# Patient Record
Sex: Female | Born: 1990 | Race: White | Hispanic: No | Marital: Single | Smoking: Current some day smoker
Health system: Southern US, Community
[De-identification: ages and names within clinical notes are randomized; demographics above are authoritative.]

## PROBLEM LIST (undated history)

## (undated) ENCOUNTER — Inpatient Hospital Stay: Payer: Self-pay

## (undated) DIAGNOSIS — Z91410 Personal history of adult physical and sexual abuse: Secondary | ICD-10-CM

## (undated) DIAGNOSIS — F419 Anxiety disorder, unspecified: Secondary | ICD-10-CM

## (undated) DIAGNOSIS — F1910 Other psychoactive substance abuse, uncomplicated: Secondary | ICD-10-CM

## (undated) DIAGNOSIS — K2920 Alcoholic gastritis without bleeding: Secondary | ICD-10-CM

## (undated) DIAGNOSIS — F17200 Nicotine dependence, unspecified, uncomplicated: Secondary | ICD-10-CM

## (undated) DIAGNOSIS — J45909 Unspecified asthma, uncomplicated: Secondary | ICD-10-CM

## (undated) DIAGNOSIS — F32A Depression, unspecified: Secondary | ICD-10-CM

## (undated) DIAGNOSIS — F329 Major depressive disorder, single episode, unspecified: Secondary | ICD-10-CM

## (undated) DIAGNOSIS — G43909 Migraine, unspecified, not intractable, without status migrainosus: Secondary | ICD-10-CM

## (undated) HISTORY — DX: Alcoholic gastritis without bleeding: K29.20

## (undated) HISTORY — PX: NO PAST SURGERIES: SHX2092

## (undated) HISTORY — DX: Nicotine dependence, unspecified, uncomplicated: F17.200

## (undated) HISTORY — DX: Unspecified asthma, uncomplicated: J45.909

## (undated) HISTORY — DX: Migraine, unspecified, not intractable, without status migrainosus: G43.909

## (undated) HISTORY — DX: Other psychoactive substance abuse, uncomplicated: F19.10

## (undated) HISTORY — DX: Personal history of adult physical and sexual abuse: Z91.410

---
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
## 2012-04-04 ENCOUNTER — Emergency Department: Payer: Self-pay | Admitting: Emergency Medicine

## 2013-03-23 ENCOUNTER — Observation Stay: Payer: Self-pay

## 2013-05-05 ENCOUNTER — Observation Stay: Payer: Self-pay

## 2013-05-05 LAB — URINALYSIS, COMPLETE
Blood: NEGATIVE
Protein: 30
RBC,UR: 11 /HPF (ref 0–5)

## 2013-05-15 ENCOUNTER — Inpatient Hospital Stay: Payer: Self-pay

## 2013-05-15 LAB — DRUG SCREEN, URINE
Barbiturates, Ur Screen: NEGATIVE (ref ?–200)
MDMA (Ecstasy)Ur Screen: NEGATIVE (ref ?–500)
Opiate, Ur Screen: POSITIVE (ref ?–300)
Phencyclidine (PCP) Ur S: NEGATIVE (ref ?–25)
Tricyclic, Ur Screen: NEGATIVE (ref ?–1000)

## 2013-05-15 LAB — CBC WITH DIFFERENTIAL/PLATELET
Basophil #: 0 10*3/uL (ref 0.0–0.1)
Eosinophil #: 0.1 10*3/uL (ref 0.0–0.7)
HCT: 34.4 % — ABNORMAL LOW (ref 35.0–47.0)
Lymphocyte %: 8.8 %
MCHC: 34.8 g/dL (ref 32.0–36.0)
MCV: 89 fL (ref 80–100)
Monocyte #: 1 x10 3/mm — ABNORMAL HIGH (ref 0.2–0.9)
Monocyte %: 5.9 %
Neutrophil #: 14.5 10*3/uL — ABNORMAL HIGH (ref 1.4–6.5)
Platelet: 346 10*3/uL (ref 150–440)
RDW: 13.5 % (ref 11.5–14.5)
WBC: 17.1 10*3/uL — ABNORMAL HIGH (ref 3.6–11.0)

## 2013-05-15 LAB — GC/CHLAMYDIA PROBE AMP

## 2014-10-27 NOTE — H&P (Signed)
L&D Evaluation:  History:  HPI Pt is a 24 yo G2P0010 at 37.[redacted] weeks GA who presents to L&D with reports of back pain x2 months, decreased fetal movement, and leaking fluid. She states that she has been having back pain the is in the right upper back and that she is "tired of nobody doing anything about it" She reports that she did not have any tylenol so she tool ibuprofen instead and has not been able to take a bath to try to get any relief "because it isn't deep enough." She reports that she has not been feeling her baby move as much but has not been doing fetal kick counts and reports to the RN that she has keep track of her baby's  movements in "my subconscious." She reports that she "may have had a small amount of leaking today but isn't sure." She denies vb or ctx. She denies urinary urgency, frequency, or burning. Her prenatal care is significant for tobacco and marijuana use, nausea and vomiting in pregnancy.   Presents with back pain, decreased fetal movement, leaking fluid   Patient's Medical History migraine headaches   Patient's Surgical History none   Medications Pre Natal Vitamins   Allergies NKDA   Social History tobacco  drugs  MJ   Family History Non-Contributory   ROS:  ROS All systems were reviewed.  HEENT, CNS, GI, GU, Respiratory, CV, Renal and Musculoskeletal systems were found to be normal.   Exam:  Vital Signs stable   Urine Protein 30 on UA   General no apparent distress   Mental Status clear   Chest clear   Heart normal sinus rhythm   Abdomen gravid, non-tender   Back no CVAT   Edema no edema   Pelvic 3/70/-1   Mebranes Intact   FHT normal rate with no decels   FHT Description 120's +accels   Ucx regular, every 2, pt  not feeling ctx   Skin dry, no lesions, no rashes   Lymph no lymphadenopathy   Other UA- trace ketones, Specific gravity- 1.039, trace protien, negative nitrites, 1+leukocytes, 11RBC's, 47 WBC's, trace bateria, 72  epithelial cells, mucous present, calcium oxalate crystals seen   Impression:  Impression reactive NST, IUP at 37.3, back pain   Plan:  Plan discharge   Comments No cervical change since admission, UA suggests of contamination, +FM and FHT reassuring on the monitor, discussed comfort measures for back pain- handouts given, discussed use of ibuprofen in pregnancy and effects on the fetus, tylenol given in the hospital for pain, FKC discussed and importance of monitoring fetal movement. Warning s/sx discussed as well as when to call the provider and s/sx of labor.   Follow Up Appointment already scheduled   Electronic Signatures: Jannet MantisSubudhi, Geneveive Furness (CNM)  (Signed [phone]-Nov-14 05:41)  Authored: L&D Evaluation   Last Updated: 17-Nov-14 05:41 by Jannet MantisSubudhi, Cathie Bonnell (CNM)

## 2014-10-27 NOTE — H&P (Signed)
L&D Evaluation:  History Expanded:  HPI Pt is a 24 yo G2P0010 at 7931w6d gestational age who presents to L&D with regular uterine contractions since her appointment yesterday afternoon.  She states that she had her membranes stripped during that appointment.  She notes occasional vaginal spotting.  Notes positive fetal movement and no leakage of fluid.  Her prenatal care is significant for tobacco and marijuana use, nausea and vomiting in pregnancy.   Group B Strep Results Maternal (Result >5wks must be treated as unknown) negative   Maternal HIV Negative   Maternal Syphilis Ab Nonreactive   [facility]   Patient's Medical History migraine headaches   Patient's Surgical History none   Medications Pre Natal Vitamins   Allergies NKDA   Social History tobacco  drugs  MJ   Family History Non-Contributory   ROS:  ROS All systems were reviewed.  HEENT, CNS, GI, GU, Respiratory, CV, Renal and Musculoskeletal systems were found to be normal.   Exam:  Vital Signs stable  T98.5, P104, BP 127/84, RR 18   General no apparent distress   Mental Status clear   Chest clear   Heart normal sinus rhythm   Abdomen gravid, non-tender   Back no CVAT   Edema no edema   Pelvic 4cm per RN   Mebranes Ruptured   Description clear   FHT normal rate with no decels   FHT Description 120's/+accels/mod var/no decels   Ucx regular, every 2   Skin dry, no lesions, no rashes   Lymph no lymphadenopathy   Impression:  Impression reactive NST, IUP at 37.3, back pain   Plan:  Plan 1) Intrauterine pregnancy at 6731w6d gestational age, 2) labor, 3) SROM   Comments admit for labor patient missing some prenatal labs and unable to obtain.  Will have them drawn stat   Electronic Signatures: Conard NovakJackson, Zhi Geier D (MD)  (Signed [phone]-Nov-14 06:27)  Authored: L&D Evaluation   Last Updated: 27-Nov-14 06:27 by Conard NovakJackson, Brit Wernette D (MD)

## 2014-10-27 NOTE — H&P (Signed)
L&D Evaluation:  History:  HPI 24 year old G2 P0010 with EDC=05/23/2013 by a 6wk5d  ultrasound presents at 7531 2/7 weeks with c/o left calf pain x 3 days. Awoke with a leg cramp early Thursday morning and pain has never resolved. Hurts worse when she walks. Denies any injury or fall.[facility], a normal anatomy scan, and MJ and tobacco use.   Presents with left calf pain   Patient's Medical History Asthma  and migraines   Patient's Surgical History none   Medications Pre Natal Vitamins   Allergies NKDA   Social History tobacco  drugs  MJ   Family History Non-Contributory   ROS:  ROS see HPI   Exam:  Vital Signs stable  120/74   Urine Protein not completed   General no apparent distress   Mental Status clear   Abdomen gravid, non-tender   Edema no edema  measurements around both calves are 30 cm. No inflammation of LE   FHT 135 with accels to 150s to 160   Ucx absent   Skin multiple tatoos and body piercings   Impression:  Impression IUP at 31 2/7 weeks with left calf pain. R/O DVT   Plan:  Plan Doppler flow study ordered on LLE. No DVTs were seen. DC home with instructions to apply heat to calf and take Caltrate 600 Plus q hs   Electronic Signatures: Trinna BalloonGutierrez, Deion Swift L (CNM)  (Signed 06-Oct-14 02:07)  Authored: L&D Evaluation   Last Updated: 06-Oct-14 02:07 by Trinna BalloonGutierrez, Aidenjames Heckmann L (CNM)

## 2015-06-20 DIAGNOSIS — K2920 Alcoholic gastritis without bleeding: Secondary | ICD-10-CM

## 2015-06-20 HISTORY — DX: Alcoholic gastritis without bleeding: K29.20

## 2015-12-01 ENCOUNTER — Emergency Department: Payer: BLUE CROSS/BLUE SHIELD

## 2015-12-01 ENCOUNTER — Encounter: Payer: Self-pay | Admitting: Emergency Medicine

## 2015-12-01 ENCOUNTER — Emergency Department
Admission: EM | Admit: 2015-12-01 | Discharge: 2015-12-01 | Disposition: A | Discharge status: Home / Self Care | Payer: BLUE CROSS/BLUE SHIELD | Attending: Emergency Medicine | Admitting: Emergency Medicine

## 2015-12-01 DIAGNOSIS — F17210 Nicotine dependence, cigarettes, uncomplicated: Secondary | ICD-10-CM | POA: Diagnosis not present

## 2015-12-01 DIAGNOSIS — K2920 Alcoholic gastritis without bleeding: Secondary | ICD-10-CM | POA: Diagnosis not present

## 2015-12-01 DIAGNOSIS — F1290 Cannabis use, unspecified, uncomplicated: Secondary | ICD-10-CM | POA: Insufficient documentation

## 2015-12-01 DIAGNOSIS — R1011 Right upper quadrant pain: Secondary | ICD-10-CM | POA: Diagnosis present

## 2015-12-01 DIAGNOSIS — R109 Unspecified abdominal pain: Secondary | ICD-10-CM

## 2015-12-01 LAB — CBC
HEMATOCRIT: 44.7 % (ref 35.0–47.0)
HEMOGLOBIN: 15.2 g/dL (ref 12.0–16.0)
MCH: 31.6 pg (ref 26.0–34.0)
MCHC: 34 g/dL (ref 32.0–36.0)
MCV: 92.8 fL (ref 80.0–100.0)
Platelets: 364 10*3/uL (ref 150–440)
RBC: 4.81 MIL/uL (ref 3.80–5.20)
RDW: 13.3 % (ref 11.5–14.5)
WBC: 6.4 10*3/uL (ref 3.6–11.0)

## 2015-12-01 LAB — COMPREHENSIVE METABOLIC PANEL
ALBUMIN: 4.4 g/dL (ref 3.5–5.0)
ALT: 19 U/L (ref 14–54)
ANION GAP: 7 (ref 5–15)
AST: 19 U/L (ref 15–41)
Alkaline Phosphatase: 69 U/L (ref 38–126)
BILIRUBIN TOTAL: 0.3 mg/dL (ref 0.3–1.2)
BUN: 10 mg/dL (ref 6–20)
CHLORIDE: 109 mmol/L (ref 101–111)
CO2: 24 mmol/L (ref 22–32)
Calcium: 9.2 mg/dL (ref 8.9–10.3)
Creatinine, Ser: 0.68 mg/dL (ref 0.44–1.00)
GFR calc Af Amer: >60 mL/min (ref >60)
GFR calc non Af Amer: >60 mL/min (ref >60)
GLUCOSE: 64 mg/dL — AB (ref 65–99)
POTASSIUM: 4.2 mmol/L (ref 3.5–5.1)
SODIUM: 140 mmol/L (ref 135–145)
TOTAL PROTEIN: 7.3 g/dL (ref 6.5–8.1)

## 2015-12-01 LAB — URINALYSIS COMPLETE WITH MICROSCOPIC (ARMC ONLY)
Bilirubin Urine: NEGATIVE
Glucose, UA: NEGATIVE mg/dL
HGB URINE DIPSTICK: NEGATIVE
Ketones, ur: NEGATIVE mg/dL
LEUKOCYTES UA: NEGATIVE
NITRITE: POSITIVE — AB
PROTEIN: NEGATIVE mg/dL
RBC / HPF: NONE SEEN RBC/hpf (ref 0–5)
SPECIFIC GRAVITY, URINE: 1.014 (ref 1.005–1.030)
pH: 6 (ref 5.0–8.0)

## 2015-12-01 LAB — GLUCOSE, CAPILLARY: GLUCOSE-CAPILLARY: 108 mg/dL — AB (ref 65–99)

## 2015-12-01 LAB — LIPASE, BLOOD: LIPASE: 23 U/L (ref 11–51)

## 2015-12-01 MED ORDER — PANTOPRAZOLE SODIUM 40 MG PO TBEC
DELAYED_RELEASE_TABLET | ORAL | Status: AC
  Filled 2015-12-01: qty 1

## 2015-12-01 MED ORDER — ONDANSETRON 4 MG PO TBDP: 4.0000 mg | ORAL_TABLET | Freq: Four times a day (QID) | ORAL | Status: DC | PRN

## 2015-12-01 MED ORDER — GI COCKTAIL ~~LOC~~
30.0000 mL | Freq: Once | ORAL | Status: AC
  Administered 2015-12-01: 30 mL via ORAL

## 2015-12-01 MED ORDER — PANTOPRAZOLE SODIUM 40 MG PO TBEC
40.0000 mg | DELAYED_RELEASE_TABLET | Freq: Every day | ORAL | Status: DC
  Administered 2015-12-01: 40 mg via ORAL

## 2015-12-01 MED ORDER — GI COCKTAIL ~~LOC~~
ORAL | Status: AC
  Filled 2015-12-01: qty 30

## 2015-12-01 NOTE — Discharge Instructions (Signed)
You were seen in the emergency room for abdominal pain. It is important that you follow up closely with your doctor.  I suggest that you seek treatment for alcohol abuse, and alcoholism. I think that this is likely the cause of your stomach symptoms, vomiting, and he may even have an early ulcer.  Please start over-the-counter Nexium, as directed on the packaging for the next 2 weeks.  Please return to the emergency room right away if you are to develop a fever, severe nausea, your pain becomes severe or worsens, you are unable to keep food down, begin vomiting any dark or bloody fluid, you develop any dark or bloody stools, feel dehydrated, or other new concerns or symptoms arise.   Gastritis, Adult Gastritis is soreness and swelling (inflammation) of the lining of the stomach. Gastritis can develop as a sudden onset (acute) or long-term (chronic) condition. If gastritis is not treated, it can lead to stomach bleeding and ulcers. CAUSES  Gastritis occurs when the stomach lining is weak or damaged. Digestive juices from the stomach then inflame the weakened stomach lining. The stomach lining may be weak or damaged due to viral or bacterial infections. One common bacterial infection is the Helicobacter pylori infection. Gastritis can also result from excessive alcohol consumption, taking certain medicines, or having too much acid in the stomach.  SYMPTOMS  In some cases, there are no symptoms. When symptoms are present, they may include:  Pain or a burning sensation in the upper abdomen.  Nausea.  Vomiting.  An uncomfortable feeling of fullness after eating. DIAGNOSIS  Your caregiver may suspect you have gastritis based on your symptoms and a physical exam. To determine the cause of your gastritis, your caregiver may perform the following:  Blood or stool tests to check for the H pylori bacterium.  Gastroscopy. A thin, flexible tube (endoscope) is passed down the esophagus and into the  stomach. The endoscope has a light and camera on the end. Your caregiver uses the endoscope to view the inside of the stomach.  Taking a tissue sample (biopsy) from the stomach to examine under a microscope. TREATMENT  Depending on the cause of your gastritis, medicines may be prescribed. If you have a bacterial infection, such as an H pylori infection, antibiotics may be given. If your gastritis is caused by too much acid in the stomach, H2 blockers or antacids may be given. Your caregiver may recommend that you stop taking aspirin, ibuprofen, or other nonsteroidal anti-inflammatory drugs (NSAIDs). HOME CARE INSTRUCTIONS  Only take over-the-counter or prescription medicines as directed by your caregiver.  If you were given antibiotic medicines, take them as directed. Finish them even if you start to feel better.  Drink enough fluids to keep your urine clear or pale yellow.  Avoid foods and drinks that make your symptoms worse, such as:  Caffeine or alcoholic drinks.  Chocolate.  Peppermint or mint flavorings.  Garlic and onions.  Spicy foods.  Citrus fruits, such as oranges, lemons, or limes.  Tomato-based foods such as sauce, chili, salsa, and pizza.  Fried and fatty foods.  Eat small, frequent meals instead of large meals. SEEK IMMEDIATE MEDICAL CARE IF:   You have black or dark red stools.  You vomit blood or material that looks like coffee grounds.  You are unable to keep fluids down.  Your abdominal pain gets worse.  You have a fever.  You do not feel better after 1 week.  You have any other questions or concerns. MAKE  SURE YOU:  Understand these instructions.  Will watch your condition.  Will get help right away if you are not doing well or get worse.   This information is not intended to replace advice given to you by your health care provider. Make sure you discuss any questions you have with your health care provider.   Document Released: 05/30/2001  Document Revised: 12/05/2011 Document Reviewed: 07/19/2011 Elsevier Interactive Patient Education [employer]

## 2015-12-01 NOTE — ED Notes (Signed)

## 2015-12-01 NOTE — ED Provider Notes (Signed)
[facility] Emergency Department Provider Note  ____________________________________________  Time seen: Approximately 4:09 PM  I have reviewed the triage vital signs and the nursing notes.   HISTORY  Chief Complaint Emesis and Abdominal Pain    HPI Jodi Cruz is a 25 y.o. female reports no significant past medical history.  Patient reports that she woke up this morning, she felt nauseated and vomited. She reports it is yellowish, but had some slight streaks of blood in it. She had a mild discomfort in the mid to right upper abdomen since this morning. No fevers or chills. No diarrhea. No black or bloody stools. No chest pain or trouble breathing.  Denies pregnancy. No urinary symptoms or abnormal odor.   Patient does report that she has occasionally woken up in the morning with vomiting, and that she thinks it may be because she drinks whiskey almost every night. She reports symptoms have occurred before, and she has never seen any blood in her vomit.   History reviewed. No pertinent past medical history.  There are no active problems to display for this patient.   History reviewed. No pertinent past surgical history.  Current Outpatient Rx  Name  Route  Sig  Dispense  Refill  . ondansetron (ZOFRAN ODT) 4 MG disintegrating tablet   Oral   Take 1 tablet (4 mg total) by mouth every 6 (six) hours as needed for nausea or vomiting.   20 tablet   0     Allergies Review of patient's allergies indicates no known allergies.  No family history on file.  Social History Social History  Substance Use Topics  . Smoking status: Current Every Day Smoker -- 0.50 packs/day    Types: Cigarettes  . Smokeless tobacco: None  . Alcohol Use: Yes     Comment: pt states a lot, pt states has a drinking problem    Review of Systems Constitutional: No fever/chills Eyes: No visual changes. ENT: No sore throat. Cardiovascular: Denies chest  pain. Respiratory: Denies shortness of breath. Gastrointestinal: Mild sense of discomfort or uneasiness in the upper stomach. No nausea. No diarrhea.  No constipation. Genitourinary: Negative for dysuria. Musculoskeletal: Negative for back pain. Skin: Negative for rash. Neurological: Negative for headaches, focal weakness or numbness.  10-point ROS otherwise negative.  ____________________________________________   PHYSICAL EXAM:  VITAL SIGNS: ED Triage Vitals  Enc Vitals Group     BP 12/01/15 1505 141/89 mmHg     Pulse Rate 12/01/15 1505 114     Resp 12/01/15 1505 18     Temp 12/01/15 1505 97.9 F (36.6 C)     Temp Source 12/01/15 1505 Oral     SpO2 12/01/15 1505 99 %     Weight 12/01/15 1505 140 lb (63.504 kg)     Height 12/01/15 1505  (1.727 m)     Head Cir --      Peak Flow --      Pain Score 12/01/15 1506 5     Pain Loc --      Pain Edu? --      Excl. in GC? --    Constitutional: Alert and oriented. Well appearing and in no acute distress.Seated in chair, stands up and ambulates to bed easily. Pleasant. Eyes: Conjunctivae are normal. PERRL. EOMI. Head: Atraumatic. Nose: No congestion/rhinnorhea. Mouth/Throat: Mucous membranes are moist.  Oropharynx non-erythematous. Neck: No stridor.   Cardiovascular: Normal rate, regular rhythm. Grossly normal heart sounds.  Good peripheral circulation. Respiratory: Normal respiratory effort.  No retractions. Lungs CTAB. Gastrointestinal: Soft and nontender. No rebound or guarding. Negative Murphy. No distention. No abdominal bruits. No CVA tenderness. Musculoskeletal: No lower extremity tenderness nor edema.  No joint effusions. Neurologic:  Normal speech and language. No gross focal neurologic deficits are appreciated. No gait instability. Skin:  Skin is warm, dry and intact. No rash noted. Psychiatric: Mood and affect are normal. Speech and behavior are normal.  ____________________________________________   LABS (all  labs ordered are listed, but only abnormal results are displayed)  Labs Reviewed  COMPREHENSIVE METABOLIC PANEL - Abnormal; Notable for the following:    Glucose, Bld 64 (*)    All other components within normal limits  URINALYSIS COMPLETEWITH MICROSCOPIC ([facility] ONLY) - Abnormal; Notable for the following:    Color, Urine YELLOW (*)    APPearance HAZY (*)    Nitrite POSITIVE (*)    Bacteria, UA MANY (*)    Squamous Epithelial / LPF 0-5 (*)    All other components within normal limits  GLUCOSE, CAPILLARY - Abnormal; Notable for the following:    Glucose-Capillary 108 (*)    All other components within normal limits  LIPASE, BLOOD  CBC  PREGNANCY, URINE  CBG MONITORING, ED   ____________________________________________  EKG   ____________________________________________  RADIOLOGY   US Abdomen Limited RUQ (Final result) Result time: 12/01/15 16:55:14   Final result by Rad Results In Interface (12/01/15 16:55:14)   Narrative:   CLINICAL DATA: Abdominal pain since this morning, vomiting  EXAM: US ABDOMEN LIMITED - RIGHT UPPER QUADRANT  COMPARISON: None.  FINDINGS: Gallbladder:  Incompletely distended. No gallbladder wall thickening or pericholecystic fluid. No calculi identified.  Common bile duct:  Diameter: 4.6 mm. Unremarkable.  Liver:  No focal lesion identified. Within normal limits in parenchymal echogenicity.  IMPRESSION: 1. Nondistended gallbladder despite NPO status suggesting chronic cholecystitis. 2. No ultrasound evidence of acute cholecystitis or biliary obstruction.   Electronically Signed By: Corlis Leak Hassell M.D. On: 12/01/2015 16:55    ____________________________________________   PROCEDURES  Procedure(s) performed: None  Critical Care performed: No  ____________________________________________   INITIAL IMPRESSION / ASSESSMENT AND PLAN / ED COURSE  Pertinent labs & imaging results that were available during my care of  the patient were reviewed by me and considered in my medical decision making (see chart for details).  Differential diagnosis includes but is not limited to, abdominal perforation, aortic dissection, cholecystitis, appendicitis, diverticulitis, colitis, esophagitis/gastritis, kidney stone, pyelonephritis, urinary tract infection, aortic aneurysm. All are considered in decision and treatment plan. Based upon the patient's presentation and risk factors, we'll proceed with ultrasound of the right upper abdomen. Based on the history given, I do not find there is evidence of bowel obstruction, peritonitis, or acute surgical abdomen. Location very atypical anything such as appendicitis.   Hemoglobin normal. Based on the history given, symptoms to be most consistent with probable acute alcoholic gastritis versus possible peptic ulcer disease.   Patient reports feeling much improved, tolerating by mouth well. Ultrasound no evidence of acute abnormality. The patient's gallbladder is likely nondistended because she is not truly been nothing by mouth for 6 hours.  The patient reports feeling much improved, stable with normal hemoglobin. There is no evidence of peritonitis or need for CT imaging at this time. Discussed with the patient, and I suspect alcohol as much to do with her symptoms. I advised her on local and provided option for detox, also provided her with careful return precautions. She is feeling much better, and I will discharge  her to home.  Return precautions and treatment recommendations and follow-up discussed with the patient who is agreeable with the plan.   ____________________________________________   FINAL CLINICAL IMPRESSION(S) / ED DIAGNOSES  Final diagnoses:  Abdominal pain  Alcoholic gastritis      Sharyn Creamer, MD 12/01/15 2104

## 2015-12-01 NOTE — ED Notes (Signed)
Pt presents to ED with reports of vomiting blood and abdominal pain that began this morning. Pt states vomited bile mixed with blood x 3 today. Pt reports RUQ abdominal pain. Pt denies diarrhea. Pt states was drinking whiskey last night but not as much as she normally does.

## 2016-10-27 ENCOUNTER — Encounter: Payer: Self-pay | Admitting: *Deleted

## 2016-10-27 ENCOUNTER — Emergency Department: Payer: BLUE CROSS/BLUE SHIELD

## 2016-10-27 ENCOUNTER — Emergency Department
Admission: EM | Admit: 2016-10-27 | Discharge: 2016-10-27 | Disposition: A | Discharge status: Home / Self Care | Payer: BLUE CROSS/BLUE SHIELD | Attending: Emergency Medicine | Admitting: Emergency Medicine

## 2016-10-27 DIAGNOSIS — F17210 Nicotine dependence, cigarettes, uncomplicated: Secondary | ICD-10-CM | POA: Insufficient documentation

## 2016-10-27 DIAGNOSIS — J302 Other seasonal allergic rhinitis: Secondary | ICD-10-CM | POA: Diagnosis not present

## 2016-10-27 DIAGNOSIS — R0789 Other chest pain: Secondary | ICD-10-CM | POA: Diagnosis not present

## 2016-10-27 DIAGNOSIS — R079 Chest pain, unspecified: Secondary | ICD-10-CM | POA: Diagnosis present

## 2016-10-27 LAB — BASIC METABOLIC PANEL
Anion gap: 8 (ref 5–15)
BUN: 13 mg/dL (ref 6–20)
CO2: 22 mmol/L (ref 22–32)
Calcium: 9 mg/dL (ref 8.9–10.3)
Chloride: 108 mmol/L (ref 101–111)
Creatinine, Ser: 0.79 mg/dL (ref 0.44–1.00)
GFR calc Af Amer: >60 mL/min (ref >60)
GLUCOSE: 89 mg/dL (ref 65–99)
Potassium: 3.9 mmol/L (ref 3.5–5.1)
Sodium: 138 mmol/L (ref 135–145)

## 2016-10-27 LAB — TROPONIN I: Troponin I: <0.03 ng/mL (ref <0.03)

## 2016-10-27 LAB — CBC
HEMATOCRIT: 41.4 % (ref 35.0–47.0)
HEMOGLOBIN: 14.3 g/dL (ref 12.0–16.0)
MCH: 32.6 pg (ref 26.0–34.0)
MCHC: 34.5 g/dL (ref 32.0–36.0)
MCV: 94.4 fL (ref 80.0–100.0)
Platelets: 424 10*3/uL (ref 150–440)
RBC: 4.39 MIL/uL (ref 3.80–5.20)
RDW: 13.3 % (ref 11.5–14.5)
WBC: 7.2 10*3/uL (ref 3.6–11.0)

## 2016-10-27 MED ORDER — ALBUTEROL SULFATE HFA 108 (90 BASE) MCG/ACT IN AERS
2.0000 | INHALATION_SPRAY | RESPIRATORY_TRACT | 0 refills | Status: DC | PRN
Start: 2016-10-27 — End: 2017-03-09

## 2016-10-27 MED ORDER — DIPHENHYDRAMINE HCL 25 MG PO CAPS: 25.0000 mg | ORAL_CAPSULE | Freq: Four times a day (QID) | ORAL | 0 refills | Status: DC | PRN

## 2016-10-27 MED ORDER — ASPIRIN 81 MG PO CHEW
324.0000 mg | CHEWABLE_TABLET | Freq: Once | ORAL | Status: AC
Start: 2016-10-27 — End: 2016-10-27
  Administered 2016-10-27: 324 mg via ORAL

## 2016-10-27 MED ORDER — ASPIRIN 81 MG PO CHEW
CHEWABLE_TABLET | ORAL | Status: AC
  Filled 2016-10-27: qty 4

## 2016-10-27 NOTE — ED Triage Notes (Addendum)
Pt states chest pressure that began last night, states cocaine use 2 days ago, denies any nausea or SOB, awake and alert in no acute distress

## 2016-10-27 NOTE — ED Provider Notes (Signed)
[facility] Emergency Department Provider Note  ____________________________________________  Time seen: Approximately 7:42 PM  I have reviewed the triage vital signs and the nursing notes.   HISTORY  Chief Complaint Chest Pain    HPI Jodi HarnessHaley M Donnelly is a 26 y.o. female who complains  chest tightness that started last night, is been persistent over the last 12-18 hours. [place] to [place]. She has been having seasonal allergies and rhinorrhea over the last few days, and also has a nonproductive cough. She also used cocaine 2 days ago which she reports was a small amount compared to what she typically does. She uses intranasally and has never injected. Denies fever chills. No vomiting or diaphoresis or shortness of breath. No exertional symptoms. No pleuritic symptoms.  Chest tightness is mild, constant, nonradiating without aggravating or alleviating factors. She feels it in the central part of the chest   History reviewed. No pertinent past medical history.   There are no active problems to display for this patient.    History reviewed. No pertinent surgical history.   Prior to Admission medications   Medication Sig Start Date End Date Taking? Authorizing Provider  albuterol (PROVENTIL HFA) 108 (90 Base) MCG/ACT inhaler Inhale 2 puffs into the lungs every 4 (four) hours as needed for wheezing or shortness of breath. 10/27/16   Sharman CheekStafford, Laniya Friedl, MD  diphenhydrAMINE (BENADRYL) 25 mg capsule Take 1 capsule (25 mg total) by mouth every 6 (six) hours as needed. 10/27/16   Sharman CheekStafford, Kendarious Gudino, MD  ondansetron (ZOFRAN ODT) 4 MG disintegrating tablet Take 1 tablet (4 mg total) by mouth every 6 (six) hours as needed for nausea or vomiting. 12/01/15   Sharyn CreamerQuale, Mark, MD     Allergies Patient has no known allergies.   History reviewed. No pertinent family history.  Social History Social History  Substance Use Topics  . Smoking  status: Current Every Day Smoker    Packs/day: 0.50    Types: Cigarettes  . Smokeless tobacco: Not on file  . Alcohol use Yes     Comment: pt states a lot, pt states has a drinking problem  Occasional cocaine use  Review of Systems  Constitutional:   No fever or chills.  ENT:   No sore throat. No rhinorrhea. Lymphatic: No swollen glands, No extremity swelling Endocrine: No hot/cold flashes. No significant weight change. No neck swelling. Cardiovascular:   No chest pain or syncope. Respiratory:   No dyspnea or cough. Gastrointestinal:   Negative for abdominal pain, vomiting and diarrhea.  Genitourinary:   Negative for dysuria or difficulty urinating. Musculoskeletal:   Negative for focal pain or swelling Neurological:   Negative for headaches or weakness. All other systems reviewed and are negative except as documented above in ROS and HPI.  ____________________________________________   PHYSICAL EXAM:  VITAL SIGNS: ED Triage Vitals  Enc Vitals Group     BP 10/27/16 1709 (!) 147/105     Pulse Rate 10/27/16 1709 94     Resp 10/27/16 1709 16     Temp 10/27/16 1709 98.3 F (36.8 C)     Temp Source 10/27/16 1709 Oral     SpO2 10/27/16 1709 98 %     Weight 10/27/16 1709 155 lb (70.3 kg)     Height 10/27/16 1709 5\' 8"  (1.727 m)     Head Circumference --      Peak Flow --      Pain Score 10/27/16 1708 5  Pain Loc --      Pain Edu? --      Excl. in GC? --     Vital signs reviewed, nursing assessments reviewed.   Constitutional:   Alert and oriented. Well appearing and in no distress. Eyes:   No scleral icterus. No conjunctival pallor. PERRL. EOMI.  No nystagmus. ENT   Head:   Normocephalic and atraumatic.   Nose:   Positive rhinorrhea. No septal hematoma or epistaxis   Mouth/Throat:   MMM, no pharyngeal erythema. No peritonsillar mass.    Neck:   No stridor. No SubQ emphysema. No meningismus. Hematological/Lymphatic/Immunilogical:   No cervical  lymphadenopathy. Cardiovascular:   RRR. Symmetric bilateral radial and DP pulses.  No murmurs.  Respiratory:   Normal respiratory effort without tachypnea nor retractions. Coarse breath sounds diffusely with slight expiratory wheezing  Gastrointestinal:   Soft and nontender. Non distended. There is no CVA tenderness.  No rebound, rigidity, or guarding. Genitourinary:   deferred Musculoskeletal:   Normal range of motion in all extremities. No joint effusions.  No lower extremity tenderness.  No edema. Neurologic:   Normal speech and language.  CN 2-10 normal. Motor grossly intact. No gross focal neurologic deficits are appreciated.  Skin:    Skin is warm, dry and intact. No rash noted.  No petechiae, purpura, or bullae.  ____________________________________________    LABS (pertinent positives/negatives) (all labs ordered are listed, but only abnormal results are displayed) Labs Reviewed  BASIC METABOLIC PANEL  CBC  TROPONIN I   ____________________________________________   EKG  Interpreted by me Sinus rhythm rate of 80, normal axis and intervals. Poor R-wave progression in anterior precordial leads. Normal ST segments and T waves.  ____________________________________________    RADIOLOGY  Dg Chest 2 View  Result Date: 10/27/2016 CLINICAL DATA:  Chest pain and shortness of breath EXAM: CHEST  2 VIEW COMPARISON:  None. FINDINGS: The heart size and mediastinal contours are within normal limits. Both lungs are clear. The visualized skeletal structures are unremarkable. IMPRESSION: No active cardiopulmonary disease. Electronically Signed   By: Deatra Robinson M.D.   On: 10/27/2016 17:40    ____________________________________________   PROCEDURES Procedures  ____________________________________________   INITIAL IMPRESSION / ASSESSMENT AND PLAN / ED COURSE  Pertinent labs & imaging results that were available during my care of the patient were reviewed by me and  considered in my medical decision making (see chart for details).  Patient well appearing no acute distress. Presents with symptoms consistent with seasonal allergies. Chest tightness like related to some mild bronchospasm as well given the auscultation findings. Symptoms do not appear to be related to recent cocaine use, not consistent with ACS PE dissection. We'll give aspirin for cardioprotection given her recent cocaine use, Benadryl and albuterol for allergic symptoms. Follow up her primary care. Counseled on cocaine avoidance.         ____________________________________________   FINAL CLINICAL IMPRESSION(S) / ED DIAGNOSES  Final diagnoses:  Atypical chest pain  Seasonal allergic rhinitis, unspecified trigger      New Prescriptions   ALBUTEROL (PROVENTIL HFA) 108 (90 BASE) MCG/ACT INHALER    Inhale 2 puffs into the lungs every 4 (four) hours as needed for wheezing or shortness of breath.   DIPHENHYDRAMINE (BENADRYL) 25 MG CAPSULE    Take 1 capsule (25 mg total) by mouth every 6 (six) hours as needed.     Portions of this note were generated with dragon dictation software. Dictation errors may occur despite best attempts at proofreading.  Sharman Cheek, MD 10/27/16 1946

## 2017-03-09 ENCOUNTER — Encounter: Payer: Self-pay | Admitting: Certified Nurse Midwife

## 2017-03-09 ENCOUNTER — Ambulatory Visit (INDEPENDENT_AMBULATORY_CARE_PROVIDER_SITE_OTHER): Payer: BLUE CROSS/BLUE SHIELD | Admitting: Certified Nurse Midwife

## 2017-03-09 VITALS — BP 118/68 | Wt 177.0 lb

## 2017-03-09 DIAGNOSIS — F1910 Other psychoactive substance abuse, uncomplicated: Secondary | ICD-10-CM

## 2017-03-09 DIAGNOSIS — Z113 Encounter for screening for infections with a predominantly sexual mode of transmission: Secondary | ICD-10-CM

## 2017-03-09 DIAGNOSIS — O0990 Supervision of high risk pregnancy, unspecified, unspecified trimester: Secondary | ICD-10-CM

## 2017-03-09 DIAGNOSIS — Z87898 Personal history of other specified conditions: Secondary | ICD-10-CM

## 2017-03-09 DIAGNOSIS — F17200 Nicotine dependence, unspecified, uncomplicated: Secondary | ICD-10-CM | POA: Insufficient documentation

## 2017-03-09 DIAGNOSIS — Z124 Encounter for screening for malignant neoplasm of cervix: Secondary | ICD-10-CM

## 2017-03-09 DIAGNOSIS — J45909 Unspecified asthma, uncomplicated: Secondary | ICD-10-CM | POA: Insufficient documentation

## 2017-03-09 DIAGNOSIS — G43909 Migraine, unspecified, not intractable, without status migrainosus: Secondary | ICD-10-CM | POA: Insufficient documentation

## 2017-03-09 DIAGNOSIS — F1911 Other psychoactive substance abuse, in remission: Secondary | ICD-10-CM

## 2017-03-09 NOTE — Progress Notes (Signed)
Pt c/o mild nausea/vomiting. Has not started PNV.
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
## 2017-03-10 LAB — RPR+RH+ABO+RUB AB+AB SCR+CB...
Antibody Screen: NEGATIVE
HIV Screen 4th Generation wRfx: NONREACTIVE
Hematocrit: 42.5 % (ref 34.0–46.6)
Hemoglobin: 13.8 g/dL (ref 11.1–15.9)
Hepatitis B Surface Ag: NEGATIVE
MCH: 31.4 pg (ref 26.6–33.0)
MCHC: 32.5 g/dL (ref 31.5–35.7)
MCV: 97 fL (ref 79–97)
PLATELETS: 411 10*3/uL — AB (ref 150–379)
RBC: 4.4 x10E6/uL (ref 3.77–5.28)
RDW: 14 % (ref 12.3–15.4)
RPR Ser Ql: NONREACTIVE
RUBELLA: 1 {index} (ref >0.99)
Rh Factor: POSITIVE
VARICELLA: 764 {index} (ref >165)
WBC: 8.9 10*3/uL (ref 3.4–10.8)

## 2017-03-14 LAB — PAP IG, CT-NG, RFX HPV ALL
CHLAMYDIA, NUC. ACID AMP: NEGATIVE
GONOCOCCUS BY NUCLEIC ACID AMP: NEGATIVE
PAP Smear Comment: 0

## 2017-03-16 ENCOUNTER — Other Ambulatory Visit: Payer: Self-pay | Admitting: Obstetrics and Gynecology

## 2017-03-16 DIAGNOSIS — O3680X0 Pregnancy with inconclusive fetal viability, not applicable or unspecified: Secondary | ICD-10-CM

## 2017-03-16 LAB — URINE DRUG PANEL 7
AMPHETAMINES, URINE: NEGATIVE ng/mL
BARBITURATE QUANT UR: NEGATIVE ng/mL
Benzodiazepine Quant, Ur: NEGATIVE ng/mL
CANNABINOID QUANT UR: POSITIVE — AB
COCAINE (METAB.): NEGATIVE ng/mL
OPIATE QUANT UR: NEGATIVE ng/mL
PCP QUANT UR: NEGATIVE ng/mL

## 2017-03-16 LAB — URINE CULTURE

## 2017-03-17 ENCOUNTER — Telehealth: Payer: Self-pay | Admitting: Certified Nurse Midwife

## 2017-03-17 DIAGNOSIS — O2341 Unspecified infection of urinary tract in pregnancy, first trimester: Secondary | ICD-10-CM | POA: Insufficient documentation

## 2017-03-17 MED ORDER — CEPHALEXIN 500 MG PO CAPS
500.0000 mg | ORAL_CAPSULE | Freq: Three times a day (TID) | ORAL | 0 refills | Status: DC
Start: 2017-03-17 — End: 2017-05-08

## 2017-03-17 NOTE — Telephone Encounter (Signed)
Urine culture returned >100K E. Coli. RX for Keflex 500 mgm TID x 7days. Increase water intake. No dysuria. Urinary frequency only. Wipe front to back. Urinate after IC.

## 2017-03-18 NOTE — Telephone Encounter (Signed)
This is a duplicate.

## 2017-03-23 ENCOUNTER — Ambulatory Visit (INDEPENDENT_AMBULATORY_CARE_PROVIDER_SITE_OTHER): Payer: BLUE CROSS/BLUE SHIELD | Admitting: Obstetrics and Gynecology

## 2017-03-23 ENCOUNTER — Ambulatory Visit (INDEPENDENT_AMBULATORY_CARE_PROVIDER_SITE_OTHER): Payer: BLUE CROSS/BLUE SHIELD

## 2017-03-23 VITALS — BP 120/82 | Wt 176.0 lb

## 2017-03-23 DIAGNOSIS — O3680X0 Pregnancy with inconclusive fetal viability, not applicable or unspecified: Secondary | ICD-10-CM

## 2017-03-23 DIAGNOSIS — O0990 Supervision of high risk pregnancy, unspecified, unspecified trimester: Secondary | ICD-10-CM

## 2017-03-23 DIAGNOSIS — Z3A01 Less than 8 weeks gestation of pregnancy: Secondary | ICD-10-CM

## 2017-03-23 DIAGNOSIS — Z1379 Encounter for other screening for genetic and chromosomal anomalies: Secondary | ICD-10-CM

## 2017-03-23 NOTE — Progress Notes (Signed)
Dating U/S Breast tenderness

## 2017-03-23 NOTE — Progress Notes (Signed)
Routine Prenatal Care Visit  Subjective  Jodi Cruz is a 26 y.o. G2P1001 at [redacted]w[redacted]d being seen today for ongoing prenatal care.  She is currently monitored for the following issues for this high-risk pregnancy and has History of substance abuse; Supervision of high risk pregnancy, antepartum; Tobacco dependence; Substance abuse (HCC); Migraine headache; and Childhood asthma on her problem list.  ----------------------------------------------------------------------------------- Patient reports no complaints.    . Vag. Bleeding: None.  Movement: Absent. Denies leaking of fluid.  ----------------------------------------------------------------------------------- The following portions of the patient's history were reviewed and updated as appropriate: allergies, current medications, past family history, past medical history, past social history, past surgical history and problem list. Problem list updated.   Objective  Weight 176 lb (79.8 kg), last menstrual period 01/29/2017. Pregravid weight 177 lb (80.3 kg) Total Weight Gain -1 lb (-0.454 kg) Urinalysis: Urine Protein: Negative Urine Glucose: Negative  Fetal Status: Fetal Heart Rate (bpm): 130   Movement: Absent     General:  Alert, oriented and cooperative. Patient is in no acute distress.  Skin: Skin is warm and dry. No rash noted.   Cardiovascular: Normal heart rate noted  Respiratory: Normal respiratory effort, no problems with respiration noted  Abdomen: Soft, gravid, appropriate for gestational age.       Pelvic:  Cervical exam deferred        Extremities: Normal range of motion.     ental Status: Normal mood and affect. Normal behavior. Normal judgment and thought content.   Based on ultrasound examination today, EDC changed to 11/11/2016 [place] Criteria for Re-dating Pregnancy  Dating Dating Discrepancy  [redacted]w[redacted]d >5 days  [redacted]w[redacted]d - [redacted]w[redacted]d >7 days  [redacted]w[redacted]d - [redacted]w[redacted]d >7 days  [redacted]w[redacted]d - [redacted]w[redacted]d >10 days  [redacted]w[redacted]d - [redacted]w[redacted]d >14 days  [redacted]w[redacted]d  and above >21 days   ACOG Committee Opinion 700 May 2017 "Methods for Estimating the Due Date"   Assessment   26 y.o. G2P1001 at [redacted]w[redacted]d by  11/05/2017, by Last Menstrual Period presenting for routine prenatal visit  Plan   pregnancy #2 Problems (from 03/09/17 to present)    Problem Noted Resolved   Supervision of high risk pregnancy, antepartum 03/09/2017 by Farrel Conners, CNM No   Overview Addendum 03/17/2017 12:49 PM by Farrel Conners, CNM     Clinic [facility] Prenatal Labs  Dating  Blood type:   A POS  Genetic Screen 1 Screen:    AFP:     Quad:     NIPS: Antibody: negative  Anatomic [place]  Rubella:  Immune; Varicella: Immune  GTT Early:               Third trimester:  RPR:   negative  Flu vaccine  HBsAg:   negative  TDaP vaccine                                               Rhogam: HIV:   negative  Baby Food                                               GBS: (For PCN allergy, check sensitivities)  Contraception  Pap:NIL  Circumcision  GC/Chlamydia: neg/neg UDS: positive MJ Urine C&S: 100K e.coli  Pediatrician    Support  Person                 Preterm labor symptoms and general obstetric precautions including but not limited to vaginal bleeding, contractions, leaking of fluid and fetal movement were reviewed in detail with the patient. Please refer to After Visit Summary for other counseling recommendations.   Return in about 4 weeks (around 04/20/2017) for ROB in 4 weeks, ROB and 1st trimester [place] in 6 weeks.

## 2017-03-23 NOTE — Patient Instructions (Signed)
First Trimester of Pregnancy The first trimester of pregnancy is from week 1 until the end of week 13 (months 1 through 3). A week after a sperm fertilizes an egg, the egg will implant on the wall of the uterus. This embryo will begin to develop into a baby. Genes from you and your partner will form the baby. The female genes will determine whether the baby will be a boy or a girl. At 6-8 weeks, the eyes and face will be formed, and the heartbeat can be seen on ultrasound. At the end of 12 weeks, all the baby's organs will be formed. Now that you are pregnant, you will want to do everything you can to have a healthy baby. Two of the most important things are to get good prenatal care and to follow your health care provider's instructions. Prenatal care is all the medical care you receive before the baby's birth. This care will help prevent, find, and treat any problems during the pregnancy and childbirth. Body changes during your first trimester Your body goes through many changes during pregnancy. The changes vary from woman to woman.  You may gain or lose a couple of pounds at first.  You may feel sick to your stomach (nauseous) and you may throw up (vomit). If the vomiting is uncontrollable, call your health care provider.  You may tire easily.  You may develop headaches that can be relieved by medicines. All medicines should be approved by your health care provider.  You may urinate more often. Painful urination may mean you have a bladder infection.  You may develop heartburn as a result of your pregnancy.  You may develop constipation because certain hormones are causing the muscles that push stool through your intestines to slow down.  You may develop hemorrhoids or swollen veins (varicose veins).  Your breasts may begin to grow larger and become tender. Your nipples may stick out more, and the tissue that surrounds them (areola) may become darker.  Your gums may bleed and may be  sensitive to brushing and flossing.  Dark spots or blotches (chloasma, mask of pregnancy) may develop on your face. This will likely fade after the baby is born.  Your menstrual periods will stop.  You may have a loss of appetite.  You may develop cravings for certain kinds of food.  You may have changes in your emotions from day to day, such as being excited to be pregnant or being concerned that something may go wrong with the pregnancy and baby.  You may have more vivid and strange dreams.  You may have changes in your hair. These can include thickening of your hair, rapid growth, and changes in texture. Some women also have hair loss during or after pregnancy, or hair that feels dry or thin. Your hair will most likely return to normal after your baby is born.  What to expect at prenatal visits During a routine prenatal visit:  You will be weighed to make sure you and the baby are growing normally.  Your blood pressure will be taken.  Your abdomen will be measured to track your baby's growth.  The fetal heartbeat will be listened to between weeks 10 and 14 of your pregnancy.  Test results from any previous visits will be discussed.  Your health care provider may ask you:  How you are feeling.  If you are feeling the baby move.  If you have had any abnormal symptoms, such as leaking fluid, bleeding, severe headaches,   or abdominal cramping.  If you are using any tobacco products, including cigarettes, chewing tobacco, and electronic cigarettes.  If you have any questions.  Other tests that may be performed during your first trimester include:  Blood tests to find your blood type and to check for the presence of any previous infections. The tests will also be used to check for low iron levels (anemia) and protein on red blood cells (Rh antibodies). Depending on your risk factors, or if you previously had diabetes during pregnancy, you may have tests to check for high blood  sugar that affects pregnant women (gestational diabetes).  Urine tests to check for infections, diabetes, or protein in the urine.  An ultrasound to confirm the proper growth and development of the baby.  Fetal screens for spinal cord problems (spina bifida) and Down syndrome.  HIV (human immunodeficiency virus) testing. Routine prenatal testing includes screening for HIV, unless you choose not to have this test.  You may need other tests to make sure you and the baby are doing well.  Follow these instructions at home: Medicines  Follow your health care provider's instructions regarding medicine use. Specific medicines may be either safe or unsafe to take during pregnancy.  Take a prenatal vitamin that contains at least 600 micrograms (mcg) of folic acid.  If you develop constipation, try taking a stool softener if your health care provider approves. Eating and drinking  Eat a balanced diet that includes fresh fruits and vegetables, whole grains, good sources of protein such as meat, eggs, or tofu, and low-fat dairy. Your health care provider will help you determine the amount of weight gain that is right for you.  Avoid raw meat and uncooked cheese. These carry germs that can cause birth defects in the baby.  Eating four or five small meals rather than three large meals a day may help relieve nausea and vomiting. If you start to feel nauseous, eating a few soda crackers can be helpful. Drinking liquids between meals, instead of during meals, also seems to help ease nausea and vomiting.  Limit foods that are high in fat and processed sugars, such as fried and sweet foods.  To prevent constipation: ? Eat foods that are high in fiber, such as fresh fruits and vegetables, whole grains, and beans. ? Drink enough fluid to keep your urine clear or pale yellow. Activity  Exercise only as directed by your health care provider. Most women can continue their usual exercise routine during  pregnancy. Try to exercise for 30 minutes at least 5 days a week. Exercising will help you: ? Control your weight. ? Stay in shape. ? Be prepared for labor and delivery.  Experiencing pain or cramping in the lower abdomen or lower back is a good sign that you should stop exercising. Check with your health care provider before continuing with normal exercises.  Try to avoid standing for long periods of time. Move your legs often if you must stand in one place for a long time.  Avoid heavy lifting.  Wear low-heeled shoes and practice good posture.  You may continue to have sex unless your health care provider tells you not to. Relieving pain and discomfort  Wear a good support bra to relieve breast tenderness.  Take warm sitz baths to soothe any pain or discomfort caused by hemorrhoids. Use hemorrhoid cream if your health care provider approves.  Rest with your legs elevated if you have leg cramps or low back pain.  If you develop   varicose veins in your legs, wear support hose. Elevate your feet for 15 minutes, 3-4 times a day. Limit salt in your diet. Prenatal care  Schedule your prenatal visits by the twelfth week of pregnancy. They are usually scheduled monthly at first, then more often in the last 2 months before delivery.  Write down your questions. Take them to your prenatal visits.  Keep all your prenatal visits as told by your health care provider. This is important. Safety  Wear your seat belt at all times when driving.  Make a list of emergency phone numbers, including numbers for family, friends, the hospital, and police and fire departments. General instructions  Ask your health care provider for a referral to a local prenatal education class. Begin classes no later than the beginning of month 6 of your pregnancy.  Ask for help if you have counseling or nutritional needs during pregnancy. Your health care provider can offer advice or refer you to specialists for help  with various needs.  Do not use hot tubs, steam rooms, or saunas.  Do not douche or use tampons or scented sanitary pads.  Do not cross your legs for long periods of time.  Avoid cat litter boxes and soil used by cats. These carry germs that can cause birth defects in the baby and possibly loss of the fetus by miscarriage or stillbirth.  Avoid all smoking, herbs, alcohol, and medicines not prescribed by your health care provider. Chemicals in these products affect the formation and growth of the baby.  Do not use any products that contain nicotine or tobacco, such as cigarettes and e-cigarettes. If you need help quitting, ask your health care provider. You may receive counseling support and other resources to help you quit.  Schedule a dentist appointment. At home, brush your teeth with a soft toothbrush and be gentle when you floss. Contact a health care provider if:  You have dizziness.  You have mild pelvic cramps, pelvic pressure, or nagging pain in the abdominal area.  You have persistent nausea, vomiting, or diarrhea.  You have a bad smelling vaginal discharge.  You have pain when you urinate.  You notice increased swelling in your face, hands, legs, or ankles.  You are exposed to fifth disease or chickenpox.  You are exposed to German measles (rubella) and have never had it. Get help right away if:  You have a fever.  You are leaking fluid from your vagina.  You have spotting or bleeding from your vagina.  You have severe abdominal cramping or pain.  You have rapid weight gain or loss.  You vomit blood or material that looks like coffee grounds.  You develop a severe headache.  You have shortness of breath.  You have any kind of trauma, such as from a fall or a car accident. Summary  The first trimester of pregnancy is from week 1 until the end of week 13 (months 1 through 3).  Your body goes through many changes during pregnancy. The changes vary from  woman to woman.  You will have routine prenatal visits. During those visits, your health care provider will examine you, discuss any test results you may have, and talk with you about how you are feeling. This information is not intended to replace advice given to you by your health care provider. Make sure you discuss any questions you have with your health care provider. Document Released: 05/30/2001 Document Revised: 05/17/2016 Document Reviewed: 05/17/2016 Elsevier Interactive Patient Education  2017 [employer]   [employer]  

## 2017-03-30 ENCOUNTER — Encounter: Payer: Self-pay | Admitting: Certified Nurse Midwife

## 2017-04-02 ENCOUNTER — Encounter: Payer: Self-pay | Admitting: Certified Nurse Midwife

## 2017-04-14 ENCOUNTER — Emergency Department: Payer: BLUE CROSS/BLUE SHIELD

## 2017-04-14 ENCOUNTER — Encounter: Payer: Self-pay | Admitting: Obstetrics and Gynecology

## 2017-04-14 ENCOUNTER — Encounter: Payer: Self-pay | Admitting: Emergency Medicine

## 2017-04-14 ENCOUNTER — Emergency Department
Admission: EM | Admit: 2017-04-14 | Discharge: 2017-04-14 | Disposition: A | Discharge status: Home / Self Care | Payer: BLUE CROSS/BLUE SHIELD | Attending: Emergency Medicine | Admitting: Emergency Medicine

## 2017-04-14 DIAGNOSIS — J45909 Unspecified asthma, uncomplicated: Secondary | ICD-10-CM | POA: Diagnosis not present

## 2017-04-14 DIAGNOSIS — O99321 Drug use complicating pregnancy, first trimester: Secondary | ICD-10-CM | POA: Insufficient documentation

## 2017-04-14 DIAGNOSIS — Z3A10 10 weeks gestation of pregnancy: Secondary | ICD-10-CM | POA: Insufficient documentation

## 2017-04-14 DIAGNOSIS — F17210 Nicotine dependence, cigarettes, uncomplicated: Secondary | ICD-10-CM | POA: Insufficient documentation

## 2017-04-14 DIAGNOSIS — O208 Other hemorrhage in early pregnancy: Secondary | ICD-10-CM | POA: Insufficient documentation

## 2017-04-14 DIAGNOSIS — O99331 Smoking (tobacco) complicating pregnancy, first trimester: Secondary | ICD-10-CM | POA: Diagnosis not present

## 2017-04-14 DIAGNOSIS — O218 Other vomiting complicating pregnancy: Secondary | ICD-10-CM | POA: Diagnosis present

## 2017-04-14 DIAGNOSIS — Z79899 Other long term (current) drug therapy: Secondary | ICD-10-CM | POA: Diagnosis not present

## 2017-04-14 DIAGNOSIS — O9952 Diseases of the respiratory system complicating childbirth: Secondary | ICD-10-CM | POA: Insufficient documentation

## 2017-04-14 DIAGNOSIS — R110 Nausea: Secondary | ICD-10-CM

## 2017-04-14 DIAGNOSIS — R109 Unspecified abdominal pain: Secondary | ICD-10-CM

## 2017-04-14 DIAGNOSIS — F1410 Cocaine abuse, uncomplicated: Secondary | ICD-10-CM | POA: Diagnosis not present

## 2017-04-14 DIAGNOSIS — K922 Gastrointestinal hemorrhage, unspecified: Secondary | ICD-10-CM

## 2017-04-14 LAB — URINALYSIS, COMPLETE (UACMP) WITH MICROSCOPIC
BACTERIA UA: NONE SEEN
BILIRUBIN URINE: NEGATIVE
Glucose, UA: NEGATIVE mg/dL
HGB URINE DIPSTICK: NEGATIVE
Ketones, ur: NEGATIVE mg/dL
LEUKOCYTES UA: NEGATIVE
Nitrite: NEGATIVE
PROTEIN: 30 mg/dL — AB
Specific Gravity, Urine: 1.018 (ref 1.005–1.030)
pH: 7 (ref 5.0–8.0)

## 2017-04-14 LAB — CBC WITH DIFFERENTIAL/PLATELET
BASOS PCT: 1 %
Basophils Absolute: 0 10*3/uL (ref 0–0.1)
Eosinophils Absolute: 0.1 10*3/uL (ref 0–0.7)
Eosinophils Relative: 1 %
HEMATOCRIT: 38.4 % (ref 35.0–47.0)
Hemoglobin: 13.5 g/dL (ref 12.0–16.0)
Lymphocytes Relative: 20 %
Lymphs Abs: 1.7 10*3/uL (ref 1.0–3.6)
MCH: 32.4 pg (ref 26.0–34.0)
MCHC: 35.2 g/dL (ref 32.0–36.0)
MCV: 91.8 fL (ref 80.0–100.0)
MONO ABS: 0.5 10*3/uL (ref 0.2–0.9)
MONOS PCT: 6 %
NEUTROS ABS: 6.2 10*3/uL (ref 1.4–6.5)
Neutrophils Relative %: 72 %
Platelets: 348 10*3/uL (ref 150–440)
RBC: 4.18 MIL/uL (ref 3.80–5.20)
RDW: 12.8 % (ref 11.5–14.5)
WBC: 8.5 10*3/uL (ref 3.6–11.0)

## 2017-04-14 LAB — CBC
HEMATOCRIT: 41.4 % (ref 35.0–47.0)
Hemoglobin: 14.2 g/dL (ref 12.0–16.0)
MCH: 31.7 pg (ref 26.0–34.0)
MCHC: 34.3 g/dL (ref 32.0–36.0)
MCV: 92.4 fL (ref 80.0–100.0)
PLATELETS: 380 10*3/uL (ref 150–440)
RBC: 4.48 MIL/uL (ref 3.80–5.20)
RDW: 13.1 % (ref 11.5–14.5)
WBC: 8.3 10*3/uL (ref 3.6–11.0)

## 2017-04-14 LAB — COMPREHENSIVE METABOLIC PANEL
ALK PHOS: 55 U/L (ref 38–126)
ALT: 19 U/L (ref 14–54)
AST: 17 U/L (ref 15–41)
Albumin: 3.8 g/dL (ref 3.5–5.0)
Anion gap: 9 (ref 5–15)
BILIRUBIN TOTAL: 0.6 mg/dL (ref 0.3–1.2)
BUN: 8 mg/dL (ref 6–20)
CO2: 21 mmol/L — AB (ref 22–32)
CREATININE: 0.52 mg/dL (ref 0.44–1.00)
Calcium: 8.7 mg/dL — ABNORMAL LOW (ref 8.9–10.3)
Chloride: 106 mmol/L (ref 101–111)
GFR calc Af Amer: >60 mL/min (ref >60)
GFR calc non Af Amer: >60 mL/min (ref >60)
Glucose, Bld: 82 mg/dL (ref 65–99)
Potassium: 3.8 mmol/L (ref 3.5–5.1)
SODIUM: 136 mmol/L (ref 135–145)
Total Protein: 7.1 g/dL (ref 6.5–8.1)

## 2017-04-14 LAB — TYPE AND SCREEN
ABO/RH(D): A POS
Antibody Screen: NEGATIVE

## 2017-04-14 LAB — HCG, QUANTITATIVE, PREGNANCY: hCG, Beta Chain, Quant, S: 103475 m[IU]/mL — ABNORMAL HIGH (ref <5)

## 2017-04-14 LAB — LIPASE, BLOOD: Lipase: 17 U/L (ref 11–51)

## 2017-04-14 MED ORDER — OMEPRAZOLE 40 MG PO CPDR: 40.0000 mg | DELAYED_RELEASE_CAPSULE | Freq: Every day | ORAL | 0 refills | Status: DC

## 2017-04-14 MED ORDER — SODIUM CHLORIDE 0.9 % IV SOLN
80.0000 mg | Freq: Once | INTRAVENOUS | Status: AC
  Administered 2017-04-14: 80 mg via INTRAVENOUS
  Filled 2017-04-14: qty 80

## 2017-04-14 MED ORDER — METOCLOPRAMIDE HCL 10 MG PO TABS: 10.0000 mg | ORAL_TABLET | Freq: Three times a day (TID) | ORAL | 0 refills | Status: DC | PRN

## 2017-04-14 NOTE — ED Notes (Signed)
Patient is calm, cooperative, eating Cheezits without c/o any discomfort. Family at bedside.
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
## 2017-04-14 NOTE — Discharge Instructions (Signed)
Return to the ER for new, worsening, or recurrent blood in the vomit or stool, lightheadedness or weakness, persistent vomiting, weakness, fevers, abdominal pain, or any other new or worsening symptoms that concern you.  Follow-up with Dr. Tobi BastosAnna this week.  Take the omeprazole as prescribed, and you may take the Reglan for nausea.

## 2017-04-14 NOTE — ED Notes (Signed)
Patient taken to ultrasound.

## 2017-04-14 NOTE — ED Triage Notes (Addendum)
Pt here for NVD for whole 10 weeks of pregnancy but worse last couple days. Reports some bright red blood in vomit today, a little each time throws up.  Has seen some dark red blood in stool.  ambulatory with steady gait. NAD. VSS. Color WNL. No active vomiting. Hx gastric ulcer r/t alcohol; has had a few glasses wine since pregnant but mostly stopped drinking.

## 2017-04-16 ENCOUNTER — Telehealth: Payer: Self-pay

## 2017-04-16 NOTE — Telephone Encounter (Signed)
Pt called after hour nurse 04/14/17 12:38 pm c/o blood in vomit and stool.  After hour nurse adv pt to go to ED which the pt did.

## 2017-04-17 ENCOUNTER — Other Ambulatory Visit: Payer: Self-pay | Admitting: Advanced Practice Midwife

## 2017-04-20 ENCOUNTER — Ambulatory Visit (INDEPENDENT_AMBULATORY_CARE_PROVIDER_SITE_OTHER): Payer: BLUE CROSS/BLUE SHIELD | Admitting: Obstetrics and Gynecology

## 2017-04-20 VITALS — BP 110/84 | Wt 171.0 lb

## 2017-04-20 DIAGNOSIS — Z1379 Encounter for other screening for genetic and chromosomal anomalies: Secondary | ICD-10-CM

## 2017-04-20 DIAGNOSIS — O0990 Supervision of high risk pregnancy, unspecified, unspecified trimester: Secondary | ICD-10-CM

## 2017-04-20 DIAGNOSIS — Z3A10 10 weeks gestation of pregnancy: Secondary | ICD-10-CM

## 2017-04-20 NOTE — Progress Notes (Signed)
ROB ER visit 04/14/17 N/V Declined Flu shot

## 2017-05-03 NOTE — Progress Notes (Signed)
Routine Prenatal Care Visit  Subjective  Jodi Cruz is a 26 y.o. G2P1001 at 4965w4d being seen today for ongoing prenatal care.  She is currently monitored for the following issues for this high-risk pregnancy and has History of substance abuse; Supervision of high risk pregnancy, antepartum; Tobacco dependence; Substance abuse (HCC); Migraine headache; Childhood asthma; and UTI (urinary tract infection) during pregnancy, first trimester on their problem list.  ----------------------------------------------------------------------------------- Patient reports no complaints.    . Vag. Bleeding: None.  Movement: Absent. Denies leaking of fluid.  ----------------------------------------------------------------------------------- The following portions of the patient's history were reviewed and updated as appropriate: allergies, current medications, past family history, past medical history, past social history, past surgical history and problem list. Problem list updated.   Objective  Last menstrual period 01/29/2017. Pregravid weight 177 lb (80.3 kg) Total Weight Gain  (-4.536 kg) Urinalysis: Urine Protein: Negative Urine Glucose: Negative  Fetal Status: Fetal Heart Rate (bpm): 155   Movement: Absent     General:  Alert, oriented and cooperative. Patient is in no acute distress.  Skin: Skin is warm and dry. No rash noted.   Cardiovascular: Normal heart rate noted  Respiratory: Normal respiratory effort, no problems with respiration noted  Abdomen: Soft, gravid, appropriate for gestational age.       Pelvic:  Cervical exam deferred        Extremities: Normal range of motion.     ental Status: Normal mood and affect. Normal behavior. Normal judgment and thought content.     Assessment   26 y.o. G2P1001 at 6765w4d by  11/11/2017, by Ultrasound presenting for routine prenatal visit  Plan   pregnancy #2 Problems (from 03/09/17 to present)    Problem Noted Resolved   Supervision  of high risk pregnancy, antepartum 03/09/2017 by Farrel ConnersGutierrez, Colleen, CNM No   Overview Addendum 04/20/2017  2:07 PM by Vena AustriaStaebler, Cyriah Childrey, MD     Clinic [facility] Prenatal Labs  Dating Redated by 554w5d US Blood type:   A POS  Genetic Screen 1 Screen:    AFP:     Antibody: negative  Anatomic US  Rubella:  Immune; Varicella: Immune  GTT Early:               Third trimester:  RPR:   negative  Flu vaccine Declines HBsAg:   negative  TDaP vaccine                                               Rhogam: N/A HIV:   negative  Baby Food                                               GBS: (For PCN allergy, check sensitivities)  Contraception  Pap:NIL  Circumcision  GC/Chlamydia: neg/neg UDS: positive MJ Urine C&S: 100K e.coli  Pediatrician    Support Person                 Preterm labor symptoms and general obstetric precautions including but not limited to vaginal bleeding, contractions, leaking of fluid and fetal movement were reviewed in detail with the patient. Please refer to After Visit Summary for other counseling recommendations.  -1st trimester screen today - follow up UCX Return in about  4 weeks (around 06/01/2017) for ROB.

## 2017-05-04 ENCOUNTER — Ambulatory Visit (INDEPENDENT_AMBULATORY_CARE_PROVIDER_SITE_OTHER): Payer: BLUE CROSS/BLUE SHIELD | Admitting: Obstetrics and Gynecology

## 2017-05-04 ENCOUNTER — Ambulatory Visit (INDEPENDENT_AMBULATORY_CARE_PROVIDER_SITE_OTHER): Payer: BLUE CROSS/BLUE SHIELD

## 2017-05-04 VITALS — BP 122/70 | Wt 167.0 lb

## 2017-05-04 DIAGNOSIS — Z1379 Encounter for other screening for genetic and chromosomal anomalies: Secondary | ICD-10-CM

## 2017-05-04 DIAGNOSIS — O2341 Unspecified infection of urinary tract in pregnancy, first trimester: Secondary | ICD-10-CM

## 2017-05-04 DIAGNOSIS — O0990 Supervision of high risk pregnancy, unspecified, unspecified trimester: Secondary | ICD-10-CM | POA: Diagnosis not present

## 2017-05-04 DIAGNOSIS — Z3A12 12 weeks gestation of pregnancy: Secondary | ICD-10-CM

## 2017-05-04 NOTE — Progress Notes (Signed)
ROB 1st trimester U/S  Taking CBD oil to help with nausea

## 2017-05-07 LAB — URINE CULTURE

## 2017-05-08 ENCOUNTER — Other Ambulatory Visit: Payer: Self-pay | Admitting: Obstetrics and Gynecology

## 2017-05-08 ENCOUNTER — Encounter: Payer: Self-pay | Admitting: Obstetrics and Gynecology

## 2017-05-08 MED ORDER — NITROFURANTOIN MONOHYD MACRO 100 MG PO CAPS: 100.0000 mg | ORAL_CAPSULE | Freq: Two times a day (BID) | ORAL | 1 refills | Status: DC

## 2017-05-08 NOTE — Progress Notes (Signed)
UTI on UCX

## 2017-05-09 ENCOUNTER — Encounter: Payer: Self-pay | Admitting: Obstetrics and Gynecology

## 2017-05-09 LAB — FIRST TRIMESTER SCREEN W/NT
CRL: 72.2 mm
DIA MOM: 1.18
DIA VALUE: 249.4 pg/mL
Gest Age-Collect: 13 weeks
Maternal Age At EDD: 25.3 yr
Nuchal Translucency MoM: 0.96
Nuchal Translucency: 1.7 mm
Number of Fetuses: 1
PAPP-A MOM: 0.49
PAPP-A Value: 483.9 ng/mL
Test Results:: NEGATIVE
Weight: 167 [lb_av]
hCG MoM: 0.78
hCG Value: 58.7 IU/mL

## 2017-05-18 ENCOUNTER — Ambulatory Visit (INDEPENDENT_AMBULATORY_CARE_PROVIDER_SITE_OTHER): Payer: BLUE CROSS/BLUE SHIELD | Admitting: Obstetrics and Gynecology

## 2017-05-18 ENCOUNTER — Encounter: Payer: Self-pay | Admitting: Obstetrics and Gynecology

## 2017-05-18 VITALS — BP 116/74 | Wt 165.0 lb

## 2017-05-18 DIAGNOSIS — Z3A14 14 weeks gestation of pregnancy: Secondary | ICD-10-CM

## 2017-05-18 DIAGNOSIS — O0990 Supervision of high risk pregnancy, unspecified, unspecified trimester: Secondary | ICD-10-CM

## 2017-05-18 DIAGNOSIS — Z87898 Personal history of other specified conditions: Secondary | ICD-10-CM

## 2017-05-18 DIAGNOSIS — F1911 Other psychoactive substance abuse, in remission: Secondary | ICD-10-CM

## 2017-05-18 DIAGNOSIS — F17200 Nicotine dependence, unspecified, uncomplicated: Secondary | ICD-10-CM

## 2017-05-18 NOTE — Progress Notes (Signed)
  Routine Prenatal Care Visit  Subjective  Jodi Cruz is a 26 y.o. G2P1001 at 855w5d being seen today for ongoing prenatal care.  She is currently monitored for the following issues for this high-risk pregnancy and has History of substance abuse; Supervision of high risk pregnancy, antepartum; Tobacco dependence; Substance abuse (HCC); Migraine headache; Childhood asthma; and UTI (urinary tract infection) during pregnancy, first trimester on their problem list.  ----------------------------------------------------------------------------------- Patient reports no complaints.    . Vag. Bleeding: None.  Movement: Absent. Denies leaking of fluid.  ----------------------------------------------------------------------------------- The following portions of the patient's history were reviewed and updated as appropriate: allergies, current medications, past family history, past medical history, past social history, past surgical history and problem list. Problem list updated.  Objective  Blood pressure 116/74, weight 165 lb (74.8 kg), last menstrual period 01/29/2017. Pregravid weight 177 lb (80.3 kg) Total Weight Gain  (-5.443 kg) Urinalysis: Urine Protein: Negative Urine Glucose: Negative  Fetal Status: Fetal Heart Rate (bpm): 155   Movement: Absent     General:  Alert, oriented and cooperative. Patient is in no acute distress.  Skin: Skin is warm and dry. No rash noted.   Cardiovascular: Normal heart rate noted  Respiratory: Normal respiratory effort, no problems with respiration noted  Abdomen: Soft, gravid, appropriate for gestational age.       Pelvic:  Cervical exam deferred        Extremities: Normal range of motion.     Mental Status: Normal mood and affect. Normal behavior. Normal judgment and thought content.   Assessment   26 y.o. G2P1001 at 6955w5d by  11/11/2017, by Ultrasound presenting for routine prenatal visit  Plan   pregnancy #2 Problems (from 03/09/17 to present)     Problem Noted Resolved   Supervision of high risk pregnancy, antepartum 03/09/2017 by Farrel ConnersGutierrez, Colleen, CNM No   Overview Addendum 05/09/2017  5:00 PM by Vena AustriaStaebler, Andreas, MD     Clinic [facility] Prenatal Labs  Dating Redated by 8425w5d US Blood type:   A POS  Genetic Screen 1 Screen: negative   AFP:     Antibody: negative  Anatomic US  Rubella:  Immune; Varicella: Immune  GTT Early:               Third trimester:  RPR:   negative  Flu vaccine Declines HBsAg:   negative  TDaP vaccine                                               Rhogam: N/A HIV:   negative  Baby Food                                               GBS: (For PCN allergy, check sensitivities)  Contraception  Pap:NIL  Circumcision  GC/Chlamydia: neg/neg UDS: positive MJ Urine C&S: 100K e.coli  Pediatrician    Support Person            Please refer to After Visit Summary for other counseling recommendations.   Return in about 4 weeks (around 06/15/2017) for schedule anatomy ultrasound and routine prenatal after.  Thomasene MohairStephen Jackson, MD  05/18/2017 12:06 PM

## 2017-06-08 ENCOUNTER — Ambulatory Visit (INDEPENDENT_AMBULATORY_CARE_PROVIDER_SITE_OTHER): Payer: BLUE CROSS/BLUE SHIELD

## 2017-06-08 ENCOUNTER — Ambulatory Visit (INDEPENDENT_AMBULATORY_CARE_PROVIDER_SITE_OTHER): Payer: BLUE CROSS/BLUE SHIELD | Admitting: Obstetrics and Gynecology

## 2017-06-08 VITALS — BP 110/50 | Wt 163.0 lb

## 2017-06-08 DIAGNOSIS — O0990 Supervision of high risk pregnancy, unspecified, unspecified trimester: Secondary | ICD-10-CM

## 2017-06-08 DIAGNOSIS — Z87898 Personal history of other specified conditions: Secondary | ICD-10-CM

## 2017-06-08 DIAGNOSIS — O2341 Unspecified infection of urinary tract in pregnancy, first trimester: Secondary | ICD-10-CM

## 2017-06-08 DIAGNOSIS — F1911 Other psychoactive substance abuse, in remission: Secondary | ICD-10-CM

## 2017-06-08 DIAGNOSIS — Z362 Encounter for other antenatal screening follow-up: Secondary | ICD-10-CM | POA: Diagnosis not present

## 2017-06-08 DIAGNOSIS — Z3A17 17 weeks gestation of pregnancy: Secondary | ICD-10-CM

## 2017-06-08 NOTE — Progress Notes (Signed)
ROB N&V U/S today

## 2017-06-08 NOTE — Addendum Note (Signed)
Addended by: Lorrene ReidSTAEBLER, Lakshmi Sundeen M on: 06/08/2017 11:44 AM   Modules accepted: Orders

## 2017-06-08 NOTE — Progress Notes (Signed)
Anatomy scan complete today

## 2017-06-15 ENCOUNTER — Telehealth: Payer: Self-pay | Admitting: Obstetrics and Gynecology

## 2017-06-15 NOTE — Telephone Encounter (Signed)
Tried calling pt to get next appt scheduled. VM not set up. When pt calls back please schedule f/u u/s and rob for 4 weeks from last appt.

## 2017-06-19 NOTE — L&D Delivery Note (Signed)
Date of delivery: 10/26/2017 Estimated Date of Delivery: 11/11/17 Patient's last menstrual period was 01/29/2017 (exact date). EGA: [redacted]w[redacted]d  Delivery Note At 11:17 AM a viable female was delivered via Vaginal, Spontaneous (Presentation: OA;  ROA).  APGAR: 5, 6, 9; weight 7 lb 2.6 oz (3250 g).   Placenta status: spontaneous, intact.  Cord:  with the following complications: none.  Cord pH: NA  Patient labored down and then pushed to deliver a viable female infant. The head followed by shoulders, which delivered without difficulty, and the rest of the body.  No nuchal cord noted.  Baby to mom's chest.  Cord clamped and cut after 1 min delay due to decreased effort of crying of newborn.  No cord blood obtained.  Placenta delivered spontaneously, intact, with a 3-vessel cord. All counts correct.  Hemostasis obtained with IV pitocin and fundal massage.    Anesthesia: epidural  Episiotomy: None Lacerations:  none Suture Repair: NA Est. Blood Loss (mL): 200  Mom to postpartum.  Baby to Couplet care / Skin to Skin.  Tresea Mall, CNM 10/26/2017, 12:06 PM

## 2017-07-24 ENCOUNTER — Encounter: Payer: BLUE CROSS/BLUE SHIELD | Admitting: Obstetrics and Gynecology

## 2017-07-26 ENCOUNTER — Ambulatory Visit (INDEPENDENT_AMBULATORY_CARE_PROVIDER_SITE_OTHER): Payer: BLUE CROSS/BLUE SHIELD | Admitting: Obstetrics & Gynecology

## 2017-07-26 ENCOUNTER — Encounter: Payer: BLUE CROSS/BLUE SHIELD | Admitting: Obstetrics & Gynecology

## 2017-07-26 VITALS — BP 102/70 | Wt 159.0 lb

## 2017-07-26 DIAGNOSIS — F1911 Other psychoactive substance abuse, in remission: Secondary | ICD-10-CM

## 2017-07-26 DIAGNOSIS — O0990 Supervision of high risk pregnancy, unspecified, unspecified trimester: Secondary | ICD-10-CM

## 2017-07-26 DIAGNOSIS — Z3A24 24 weeks gestation of pregnancy: Secondary | ICD-10-CM

## 2017-07-26 DIAGNOSIS — F17200 Nicotine dependence, unspecified, uncomplicated: Secondary | ICD-10-CM

## 2017-07-26 DIAGNOSIS — Z87898 Personal history of other specified conditions: Secondary | ICD-10-CM

## 2017-07-26 MED ORDER — BUPROPION HCL ER (XL) 150 MG PO TB24: 150.0000 mg | ORAL_TABLET | Freq: Every day | ORAL | 4 refills | Status: DC

## 2017-07-26 MED ORDER — PYRIDOXINE HCL 25 MG PO TABS: 25.0000 mg | ORAL_TABLET | Freq: Three times a day (TID) | ORAL | 3 refills | Status: DC

## 2017-07-26 MED ORDER — METOCLOPRAMIDE HCL 10 MG PO TABS: 10.0000 mg | ORAL_TABLET | Freq: Three times a day (TID) | ORAL | 2 refills | Status: DC

## 2017-07-26 NOTE — Progress Notes (Signed)
  Subjective  Fetal Movement? yes Contractions? no Leaking Fluid? no Vaginal Bleeding? No Nausea and low appetite; weight loss Depression; prior hx; prior poor tolerence to Zoloft  Objective  BP 102/70   Wt 159 lb (72.1 kg)   LMP 01/29/2017 (Exact Date)   BMI 24.18 kg/m  General: NAD Pumonary: no increased work of breathing Abdomen: gravid, non-tender Extremities: no edema Psychiatric: mood appropriate, affect full  Assessment  27 y.o. G2P1001 at 5683w4d by  11/11/2017, by Ultrasound presenting for routine prenatal visit  Plan   Problem List Items Addressed This Visit      Other   History of substance abuse   Supervision of high risk pregnancy, antepartum   Relevant Orders   Urine Culture   28 Week RH+Panel   Tobacco dependence    Other Visit Diagnoses    [redacted] weeks gestation of pregnancy    -  Primary   Relevant Orders   Urine Culture   28 Week RH+Panel    Reglan and B6 for nausea; protein and diet rec's made Wellbutrin 150 mg for depression; risks w preg and breast feeding discussed U C&S today as follow up Declines Flu Shot; counseled as to risks of flu and pregnancy Glucola nv  Annamarie MajorPaul Quavon Keisling, MD, Merlinda FrederickFACOG [facility], [facility] 07/26/2017  11:16 AM

## 2017-07-26 NOTE — Patient Instructions (Signed)

## 2017-07-30 LAB — URINE CULTURE

## 2017-07-31 ENCOUNTER — Other Ambulatory Visit: Payer: Self-pay | Admitting: Obstetrics & Gynecology

## 2017-07-31 MED ORDER — NITROFURANTOIN MONOHYD MACRO 100 MG PO CAPS: 100.0000 mg | ORAL_CAPSULE | Freq: Two times a day (BID) | ORAL | 0 refills | Status: DC

## 2017-07-31 NOTE — Progress Notes (Signed)
Unable to leave message.  ABX eRx.  MyChart message left for patient.

## 2017-08-23 ENCOUNTER — Encounter: Payer: Self-pay | Admitting: *Deleted

## 2017-08-23 ENCOUNTER — Encounter: Payer: BLUE CROSS/BLUE SHIELD | Admitting: Advanced Practice Midwife

## 2017-08-23 ENCOUNTER — Encounter: Payer: BLUE CROSS/BLUE SHIELD | Admitting: Obstetrics and Gynecology

## 2017-08-23 ENCOUNTER — Observation Stay
Admission: EM | Admit: 2017-08-23 | Discharge: 2017-08-23 | Disposition: A | Discharge status: Home / Self Care | Payer: BLUE CROSS/BLUE SHIELD | Attending: Obstetrics and Gynecology | Admitting: Obstetrics and Gynecology

## 2017-08-23 ENCOUNTER — Other Ambulatory Visit: Payer: BLUE CROSS/BLUE SHIELD

## 2017-08-23 DIAGNOSIS — O26893 Other specified pregnancy related conditions, third trimester: Principal | ICD-10-CM | POA: Insufficient documentation

## 2017-08-23 DIAGNOSIS — R112 Nausea with vomiting, unspecified: Secondary | ICD-10-CM | POA: Insufficient documentation

## 2017-08-23 DIAGNOSIS — Z3A28 28 weeks gestation of pregnancy: Secondary | ICD-10-CM | POA: Diagnosis not present

## 2017-08-23 DIAGNOSIS — M545 Low back pain: Secondary | ICD-10-CM | POA: Diagnosis not present

## 2017-08-23 HISTORY — DX: Major depressive disorder, single episode, unspecified: F32.9

## 2017-08-23 HISTORY — DX: Depression, unspecified: F32.A

## 2017-08-23 HISTORY — DX: Anxiety disorder, unspecified: F41.9

## 2017-08-23 LAB — URINE DRUG SCREEN, QUALITATIVE (ARMC ONLY)
Amphetamines, Ur Screen: NOT DETECTED
Barbiturates, Ur Screen: NOT DETECTED
Benzodiazepine, Ur Scrn: NOT DETECTED
CANNABINOID 50 NG, UR ~~LOC~~: POSITIVE — AB
COCAINE METABOLITE, UR ~~LOC~~: NOT DETECTED
MDMA (ECSTASY) UR SCREEN: NOT DETECTED
Methadone Scn, Ur: NOT DETECTED
Opiate, Ur Screen: NOT DETECTED
Phencyclidine (PCP) Ur S: NOT DETECTED
Tricyclic, Ur Screen: NOT DETECTED

## 2017-08-23 LAB — URINALYSIS, COMPLETE (UACMP) WITH MICROSCOPIC
BILIRUBIN URINE: NEGATIVE
GLUCOSE, UA: NEGATIVE mg/dL
HGB URINE DIPSTICK: NEGATIVE
Ketones, ur: 80 mg/dL — AB
LEUKOCYTES UA: NEGATIVE
NITRITE: NEGATIVE
PROTEIN: NEGATIVE mg/dL
Specific Gravity, Urine: 1.019 (ref 1.005–1.030)
pH: 6 (ref 5.0–8.0)

## 2017-08-23 MED ORDER — ACETAMINOPHEN 325 MG PO TABS
650.0000 mg | ORAL_TABLET | Freq: Once | ORAL | Status: AC
  Administered 2017-08-23: 650 mg via ORAL

## 2017-08-23 MED ORDER — ACETAMINOPHEN 325 MG PO TABS
ORAL_TABLET | ORAL | Status: AC
  Administered 2017-08-23: 650 mg via ORAL
  Filled 2017-08-23: qty 2

## 2017-08-23 MED ORDER — ONDANSETRON 4 MG PO TBDP
4.0000 mg | ORAL_TABLET | Freq: Four times a day (QID) | ORAL | Status: DC | PRN
Start: 2017-08-23 — End: 2017-08-24
  Administered 2017-08-23: 4 mg via ORAL
  Filled 2017-08-23: qty 1

## 2017-08-23 MED ORDER — CYCLOBENZAPRINE HCL 10 MG PO TABS: 10.0000 mg | ORAL_TABLET | Freq: Three times a day (TID) | ORAL | 2 refills | Status: DC | PRN

## 2017-08-23 MED ORDER — ONDANSETRON 4 MG PO TBDP: 4.0000 mg | ORAL_TABLET | Freq: Four times a day (QID) | ORAL | 0 refills | Status: DC | PRN

## 2017-08-23 NOTE — OB Triage Note (Signed)
Pt arrived from home with complaints of lower abdominal pain, back pain, nausea and vomiting for the past two days. Pt also complains of weakness in hands and leg pain. Pt denies any leaking of fluid or vaginal bleeding. Pt reports positive fetal movement.

## 2017-08-23 NOTE — Discharge Instructions (Signed)
Follow up with next prenatal appointment. Call to make appointment.  Follow a bland diet and gradually increase to regular diet as symptoms improve.

## 2017-08-23 NOTE — Discharge Summary (Signed)
See final progress note. 

## 2017-08-23 NOTE — Final Progress Note (Signed)
Physician Final Progress Note  Patient ID: Jodi Cruz MRN: [id number] DOB/AGE: Jun 17, 1991 27 y.o.  Admit date: 08/23/2017 Admitting provider: Vena Austria, MD Discharge date: 08/23/2017   Admission Diagnoses: Nausea and vomitting  Discharge Diagnoses:  Active Problems:   Labor and delivery indication for care or intervention  27 year old G2P1001 at [redacted]w[redacted]d presenting with 24-hrs of nausea and emesis.  No emesis over the past 8-hrs.  No sick contacts.  Denies URI symptoms.  Does report some lumbago with UA negative other than ketones.  She states over the past month she has also started experiencing night time leg cramps.  The patient was discharged on po Zofran ODT instructed on BRAT diet, flexeril written prn for leg cramps.    Consults: None  Significant Findings/ Diagnostic Studies:  Results for orders placed or performed during the hospital encounter of 08/23/17 (from the past 24 hour(s))  Urinalysis, Complete w Microscopic     Status: Abnormal   Collection Time: 08/23/17  9:29 PM  Result Value Ref Range   Color, Urine AMBER (A) YELLOW   APPearance HAZY (A) CLEAR   Specific Gravity, Urine 1.019 1.005 - 1.030   pH 6.0 5.0 - 8.0   Glucose, UA NEGATIVE NEGATIVE mg/dL   Hgb urine dipstick NEGATIVE NEGATIVE   Bilirubin Urine NEGATIVE NEGATIVE   Ketones, ur 80 (A) NEGATIVE mg/dL   Protein, ur NEGATIVE NEGATIVE mg/dL   Nitrite NEGATIVE NEGATIVE   Leukocytes, UA NEGATIVE NEGATIVE   RBC / HPF 0-5 0 - 5 RBC/hpf   WBC, UA 0-5 0 - 5 WBC/hpf   Bacteria, UA RARE (A) NONE SEEN   Squamous Epithelial / LPF 0-5 (A) NONE SEEN   Mucus PRESENT   Urine Drug Screen, Qualitative ([facility] only)     Status: Abnormal   Collection Time: 08/23/17  9:29 PM  Result Value Ref Range   Tricyclic, Ur Screen NONE DETECTED NONE DETECTED   Amphetamines, Ur Screen NONE DETECTED NONE DETECTED   MDMA (Ecstasy)Ur Screen NONE DETECTED NONE DETECTED   Cocaine Metabolite,Ur [place] NONE DETECTED NONE DETECTED   Opiate, Ur Screen NONE DETECTED NONE DETECTED   Phencyclidine (PCP) Ur S NONE DETECTED NONE DETECTED   Cannabinoid 50 Ng, Ur [place] POSITIVE (A) NONE DETECTED   Barbiturates, Ur Screen NONE DETECTED NONE DETECTED   Benzodiazepine, Ur Scrn NONE DETECTED NONE DETECTED   Methadone Scn, Ur NONE DETECTED NONE DETECTED     Procedures:  Baseline: 145  Variability: moderate Accelerations: present Decelerations: absent Tocometry: none The patient was monitored for 30 minutes, fetal heart rate tracing was deemed reactive, category I tracing,    Discharge Condition: good  Disposition: 01-Home or Self Care  Diet: Encourage fluids  Discharge Activity: Activity as tolerated  Discharge Instructions    Discharge activity:  No Restrictions   Complete by:  As directed    Discharge diet:  No restrictions   Complete by:  As directed    No sexual activity restrictions   Complete by:  As directed    Notify physician for a general feeling that "something is not right"   Complete by:  As directed    Notify physician for increase or change in vaginal discharge   Complete by:  As directed    Notify physician for intestinal cramps, with or without diarrhea, sometimes described as "gas pain"   Complete by:  As directed    Notify physician for leaking of fluid   Complete by:  As directed  Notify physician for low, dull backache, unrelieved by heat or Tylenol   Complete by:  As directed    Notify physician for menstrual like cramps   Complete by:  As directed    Notify physician for pelvic pressure   Complete by:  As directed    Notify physician for uterine contractions.  These may be painless and feel like the uterus is tightening or the baby is  "balling up"   Complete by:  As directed    Notify physician for vaginal bleeding   Complete by:  As directed    PRETERM LABOR:  Includes any of the follwing symptoms that occur between 20 - [redacted] weeks gestation.  If these symptoms are not stopped,  preterm labor can result in preterm delivery, placing your baby at risk   Complete by:  As directed      Allergies as of 08/23/2017   No Known Allergies     Medication List    STOP taking these medications   nitrofurantoin (macrocrystal-monohydrate) 100 MG capsule Commonly known as:  MACROBID   pyridOXINE 25 MG tablet Commonly known as:  VITAMIN B-6     TAKE these medications   buPROPion 150 MG 24 hr tablet Commonly known as:  WELLBUTRIN XL Take 1 tablet (150 mg total) by mouth daily.   cyclobenzaprine 10 MG tablet Commonly known as:  FLEXERIL Take 1 tablet (10 mg total) by mouth 3 (three) times daily as needed for muscle spasms.   metoCLOPramide 10 MG tablet Commonly known as:  REGLAN Take 1 tablet (10 mg total) by mouth 3 (three) times daily before meals.   ondansetron 4 MG disintegrating tablet Commonly known as:  ZOFRAN-ODT Take 1 tablet (4 mg total) by mouth every 6 (six) hours as needed for nausea or vomiting.   PRENATAL ADULT GUMMY/DHA/FA PO Take 2 tablets by mouth.        Total time spent taking care of this patient: 30 minutes  Signed: Vena Austriandreas Clarabelle Oscarson 08/23/2017, 10:31 PM

## 2017-09-22 ENCOUNTER — Emergency Department
Admission: EM | Admit: 2017-09-22 | Discharge: 2017-09-22 | Disposition: A | Discharge status: Home / Self Care | Payer: BLUE CROSS/BLUE SHIELD | Attending: Emergency Medicine | Admitting: Emergency Medicine

## 2017-09-22 ENCOUNTER — Encounter: Payer: Self-pay | Admitting: Emergency Medicine

## 2017-09-22 ENCOUNTER — Emergency Department: Payer: BLUE CROSS/BLUE SHIELD

## 2017-09-22 ENCOUNTER — Other Ambulatory Visit: Payer: Self-pay

## 2017-09-22 DIAGNOSIS — R05 Cough: Secondary | ICD-10-CM | POA: Diagnosis not present

## 2017-09-22 DIAGNOSIS — Z79899 Other long term (current) drug therapy: Secondary | ICD-10-CM | POA: Insufficient documentation

## 2017-09-22 DIAGNOSIS — Z3A33 33 weeks gestation of pregnancy: Secondary | ICD-10-CM | POA: Diagnosis not present

## 2017-09-22 DIAGNOSIS — O99333 Smoking (tobacco) complicating pregnancy, third trimester: Secondary | ICD-10-CM | POA: Diagnosis not present

## 2017-09-22 DIAGNOSIS — F17210 Nicotine dependence, cigarettes, uncomplicated: Secondary | ICD-10-CM | POA: Diagnosis not present

## 2017-09-22 DIAGNOSIS — O99513 Diseases of the respiratory system complicating pregnancy, third trimester: Secondary | ICD-10-CM | POA: Insufficient documentation

## 2017-09-22 DIAGNOSIS — J189 Pneumonia, unspecified organism: Secondary | ICD-10-CM | POA: Insufficient documentation

## 2017-09-22 DIAGNOSIS — O9952 Diseases of the respiratory system complicating childbirth: Secondary | ICD-10-CM | POA: Diagnosis present

## 2017-09-22 MED ORDER — ACETAMINOPHEN 500 MG PO TABS
ORAL_TABLET | ORAL | Status: AC
  Filled 2017-09-22: qty 2

## 2017-09-22 MED ORDER — AZITHROMYCIN 250 MG PO TABS: ORAL_TABLET | ORAL | 0 refills | Status: DC

## 2017-09-22 MED ORDER — AZITHROMYCIN 500 MG PO TABS
500.0000 mg | ORAL_TABLET | Freq: Once | ORAL | Status: AC
  Administered 2017-09-22: 500 mg via ORAL
  Filled 2017-09-22: qty 1

## 2017-09-22 MED ORDER — ACETAMINOPHEN 500 MG PO TABS
1000.0000 mg | ORAL_TABLET | Freq: Once | ORAL | Status: AC
  Administered 2017-09-22: 1000 mg via ORAL
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
## 2017-09-22 NOTE — ED Triage Notes (Signed)
Cough x 2 weeks. States thought was viral bu concerned cough has continued. [redacted] weeks pregnant with no vag bleed, fluid or contractions above her normal braxton hicks contractions.

## 2017-10-24 ENCOUNTER — Other Ambulatory Visit: Payer: Self-pay

## 2017-10-24 ENCOUNTER — Observation Stay
Admission: EM | Admit: 2017-10-24 | Discharge: 2017-10-25 | Disposition: A | Discharge status: Home / Self Care | Payer: BLUE CROSS/BLUE SHIELD | Source: Home / Self Care | Admitting: Obstetrics & Gynecology

## 2017-10-24 DIAGNOSIS — F17210 Nicotine dependence, cigarettes, uncomplicated: Secondary | ICD-10-CM

## 2017-10-24 DIAGNOSIS — O99333 Smoking (tobacco) complicating pregnancy, third trimester: Secondary | ICD-10-CM | POA: Insufficient documentation

## 2017-10-24 DIAGNOSIS — Z9119 Patient's noncompliance with other medical treatment and regimen: Secondary | ICD-10-CM | POA: Insufficient documentation

## 2017-10-24 DIAGNOSIS — Z8249 Family history of ischemic heart disease and other diseases of the circulatory system: Secondary | ICD-10-CM | POA: Insufficient documentation

## 2017-10-24 DIAGNOSIS — Z3A37 37 weeks gestation of pregnancy: Secondary | ICD-10-CM | POA: Insufficient documentation

## 2017-10-24 DIAGNOSIS — O471 False labor at or after 37 completed weeks of gestation: Secondary | ICD-10-CM | POA: Insufficient documentation

## 2017-10-24 DIAGNOSIS — O1203 Gestational edema, third trimester: Secondary | ICD-10-CM | POA: Diagnosis present

## 2017-10-24 DIAGNOSIS — O09899 Supervision of other high risk pregnancies, unspecified trimester: Secondary | ICD-10-CM

## 2017-10-24 LAB — PROTEIN / CREATININE RATIO, URINE
CREATININE, URINE: 117 mg/dL
Protein Creatinine Ratio: 0.09 mg/mg{Cre} (ref 0.00–0.15)
TOTAL PROTEIN, URINE: 11 mg/dL

## 2017-10-25 ENCOUNTER — Encounter: Payer: BLUE CROSS/BLUE SHIELD | Admitting: Obstetrics and Gynecology

## 2017-10-25 DIAGNOSIS — Z9119 Patient's noncompliance with other medical treatment and regimen: Secondary | ICD-10-CM

## 2017-10-25 DIAGNOSIS — Z91199 Patient's noncompliance with other medical treatment and regimen due to unspecified reason: Secondary | ICD-10-CM

## 2017-10-25 DIAGNOSIS — O09899 Supervision of other high risk pregnancies, unspecified trimester: Secondary | ICD-10-CM

## 2017-10-25 LAB — CBC
HCT: 30.3 % — ABNORMAL LOW (ref 35.0–47.0)
Hemoglobin: 10.4 g/dL — ABNORMAL LOW (ref 12.0–16.0)
MCH: 29.9 pg (ref 26.0–34.0)
MCHC: 34.2 g/dL (ref 32.0–36.0)
MCV: 87.3 fL (ref 80.0–100.0)
PLATELETS: 353 10*3/uL (ref 150–440)
RBC: 3.47 MIL/uL — ABNORMAL LOW (ref 3.80–5.20)
RDW: 13.5 % (ref 11.5–14.5)
WBC: 9.9 10*3/uL (ref 3.6–11.0)

## 2017-10-25 LAB — COMPREHENSIVE METABOLIC PANEL
ALBUMIN: 2.7 g/dL — AB (ref 3.5–5.0)
ALK PHOS: 142 U/L — AB (ref 38–126)
ALT: 10 U/L — ABNORMAL LOW (ref 14–54)
AST: 16 U/L (ref 15–41)
Anion gap: 5 (ref 5–15)
BILIRUBIN TOTAL: 0.5 mg/dL (ref 0.3–1.2)
BUN: 8 mg/dL (ref 6–20)
CALCIUM: 8.3 mg/dL — AB (ref 8.9–10.3)
CO2: 22 mmol/L (ref 22–32)
CREATININE: 0.33 mg/dL — AB (ref 0.44–1.00)
Chloride: 108 mmol/L (ref 101–111)
GFR calc Af Amer: >60 mL/min (ref >60)
GFR calc non Af Amer: >60 mL/min (ref >60)
GLUCOSE: 70 mg/dL (ref 65–99)
POTASSIUM: 3.6 mmol/L (ref 3.5–5.1)
Sodium: 135 mmol/L (ref 135–145)
TOTAL PROTEIN: 5.9 g/dL — AB (ref 6.5–8.1)

## 2017-10-25 LAB — URINALYSIS, COMPLETE (UACMP) WITH MICROSCOPIC
Bilirubin Urine: NEGATIVE
GLUCOSE, UA: NEGATIVE mg/dL
HGB URINE DIPSTICK: NEGATIVE
Ketones, ur: NEGATIVE mg/dL
Leukocytes, UA: NEGATIVE
NITRITE: NEGATIVE
PH: 7 (ref 5.0–8.0)
PROTEIN: NEGATIVE mg/dL
SPECIFIC GRAVITY, URINE: 1.019 (ref 1.005–1.030)

## 2017-10-25 LAB — URINE DRUG SCREEN, QUALITATIVE (ARMC ONLY)
Amphetamines, Ur Screen: NOT DETECTED
Barbiturates, Ur Screen: NOT DETECTED
Benzodiazepine, Ur Scrn: NOT DETECTED
CANNABINOID 50 NG, UR ~~LOC~~: POSITIVE — AB
COCAINE METABOLITE, UR ~~LOC~~: NOT DETECTED
MDMA (ECSTASY) UR SCREEN: NOT DETECTED
Methadone Scn, Ur: NOT DETECTED
Opiate, Ur Screen: NOT DETECTED
PHENCYCLIDINE (PCP) UR S: NOT DETECTED
TRICYCLIC, UR SCREEN: NOT DETECTED

## 2017-10-25 LAB — CHLAMYDIA/NGC RT PCR (ARMC ONLY)
CHLAMYDIA TR: NOT DETECTED
N gonorrhoeae: NOT DETECTED

## 2017-10-25 LAB — LIPASE, BLOOD: LIPASE: 27 U/L (ref 11–51)

## 2017-10-25 MED ORDER — PRENATAL VITAMINS 0.8 MG PO TABS: 1.0000 | ORAL_TABLET | Freq: Every day | ORAL | 12 refills | Status: DC
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
## 2017-10-25 NOTE — OB Triage Note (Signed)
Patient came in for observation for lower extremity swelling that started the last three days. Patient denies uterine contractions. Patient reports + FM. Patient denies leaking of fluid, denies vaginal bleeding and spotting. Vital signs stable and patient afebrile. FHR baseline 130 with moderate variability with accelerations 15 x 15 and no decelerations. Significant other at bedside. Will continue to monitor.

## 2017-10-26 ENCOUNTER — Inpatient Hospital Stay: Payer: BLUE CROSS/BLUE SHIELD | Admitting: Registered Nurse

## 2017-10-26 ENCOUNTER — Encounter: Payer: Self-pay | Admitting: *Deleted

## 2017-10-26 ENCOUNTER — Other Ambulatory Visit: Payer: Self-pay

## 2017-10-26 ENCOUNTER — Inpatient Hospital Stay
Admission: EM | Admit: 2017-10-26 | Discharge: 2017-10-27 | DRG: 807 | Disposition: A | Discharge status: Home / Self Care | Payer: BLUE CROSS/BLUE SHIELD | Attending: Obstetrics & Gynecology | Admitting: Obstetrics & Gynecology

## 2017-10-26 DIAGNOSIS — O99334 Smoking (tobacco) complicating childbirth: Secondary | ICD-10-CM | POA: Diagnosis present

## 2017-10-26 DIAGNOSIS — R109 Unspecified abdominal pain: Secondary | ICD-10-CM | POA: Diagnosis present

## 2017-10-26 DIAGNOSIS — O99324 Drug use complicating childbirth: Principal | ICD-10-CM | POA: Diagnosis present

## 2017-10-26 DIAGNOSIS — Z3483 Encounter for supervision of other normal pregnancy, third trimester: Secondary | ICD-10-CM | POA: Diagnosis present

## 2017-10-26 DIAGNOSIS — O0990 Supervision of high risk pregnancy, unspecified, unspecified trimester: Secondary | ICD-10-CM

## 2017-10-26 DIAGNOSIS — F17210 Nicotine dependence, cigarettes, uncomplicated: Secondary | ICD-10-CM | POA: Diagnosis present

## 2017-10-26 DIAGNOSIS — F1290 Cannabis use, unspecified, uncomplicated: Secondary | ICD-10-CM | POA: Diagnosis present

## 2017-10-26 DIAGNOSIS — Z3A37 37 weeks gestation of pregnancy: Secondary | ICD-10-CM

## 2017-10-26 LAB — URINE DRUG SCREEN, QUALITATIVE (ARMC ONLY)
Amphetamines, Ur Screen: NOT DETECTED
BARBITURATES, UR SCREEN: NOT DETECTED
BENZODIAZEPINE, UR SCRN: NOT DETECTED
CANNABINOID 50 NG, UR ~~LOC~~: POSITIVE — AB
Cocaine Metabolite,Ur ~~LOC~~: NOT DETECTED
MDMA (Ecstasy)Ur Screen: NOT DETECTED
METHADONE SCREEN, URINE: NOT DETECTED
Opiate, Ur Screen: NOT DETECTED
Phencyclidine (PCP) Ur S: NOT DETECTED
Tricyclic, Ur Screen: NOT DETECTED

## 2017-10-26 LAB — TYPE AND SCREEN
ABO/RH(D): A POS
Antibody Screen: NEGATIVE

## 2017-10-26 LAB — CBC
HCT: 32.7 % — ABNORMAL LOW (ref 35.0–47.0)
HEMOGLOBIN: 11.2 g/dL — AB (ref 12.0–16.0)
MCH: 29.5 pg (ref 26.0–34.0)
MCHC: 34.3 g/dL (ref 32.0–36.0)
MCV: 86.1 fL (ref 80.0–100.0)
Platelets: 389 10*3/uL (ref 150–440)
RBC: 3.81 MIL/uL (ref 3.80–5.20)
RDW: 13.4 % (ref 11.5–14.5)
WBC: 13.4 10*3/uL — AB (ref 3.6–11.0)

## 2017-10-26 LAB — CHLAMYDIA/NGC RT PCR (ARMC ONLY)
CHLAMYDIA TR: NOT DETECTED
N GONORRHOEAE: NOT DETECTED

## 2017-10-26 MED ORDER — WITCH HAZEL-GLYCERIN EX PADS: 1.0000 "application " | MEDICATED_PAD | CUTANEOUS | Status: DC | PRN

## 2017-10-26 MED ORDER — IBUPROFEN 600 MG PO TABS
600.0000 mg | ORAL_TABLET | Freq: Four times a day (QID) | ORAL | Status: DC
  Administered 2017-10-26 – 2017-10-27 (×4): 600 mg via ORAL
  Filled 2017-10-26 (×5): qty 1

## 2017-10-26 MED ORDER — FENTANYL 2.5 MCG/ML W/ROPIVACAINE 0.15% IN NS 100 ML EPIDURAL (ARMC)
EPIDURAL | Status: AC
  Filled 2017-10-26: qty 100

## 2017-10-26 MED ORDER — PRENATAL MULTIVITAMIN CH
1.0000 | ORAL_TABLET | Freq: Every day | ORAL | Status: DC
  Administered 2017-10-26 – 2017-10-27 (×2): 1 via ORAL
  Filled 2017-10-26 (×2): qty 1

## 2017-10-26 MED ORDER — SOD CITRATE-CITRIC ACID 500-334 MG/5ML PO SOLN: 30.0000 mL | ORAL | Status: DC | PRN

## 2017-10-26 MED ORDER — DIPHENHYDRAMINE HCL 50 MG/ML IJ SOLN: 12.5000 mg | INTRAMUSCULAR | Status: DC | PRN

## 2017-10-26 MED ORDER — ONDANSETRON HCL 4 MG/2ML IJ SOLN: 4.0000 mg | Freq: Four times a day (QID) | INTRAMUSCULAR | Status: DC | PRN

## 2017-10-26 MED ORDER — LIDOCAINE-EPINEPHRINE (PF) 1.5 %-1:200000 IJ SOLN
INTRAMUSCULAR | Status: DC | PRN
  Administered 2017-10-26: 3 mL via PERINEURAL

## 2017-10-26 MED ORDER — OXYCODONE-ACETAMINOPHEN 5-325 MG PO TABS: 1.0000 | ORAL_TABLET | ORAL | Status: DC | PRN

## 2017-10-26 MED ORDER — TETANUS-DIPHTH-ACELL PERTUSSIS 5-2.5-18.5 LF-MCG/0.5 IM SUSP: 0.5000 mL | Freq: Once | INTRAMUSCULAR | Status: DC

## 2017-10-26 MED ORDER — SENNOSIDES-DOCUSATE SODIUM 8.6-50 MG PO TABS: 2.0000 | ORAL_TABLET | ORAL | Status: DC

## 2017-10-26 MED ORDER — BUPIVACAINE HCL (PF) 0.25 % IJ SOLN
INTRAMUSCULAR | Status: DC | PRN
  Administered 2017-10-26 (×2): 4 mL via EPIDURAL

## 2017-10-26 MED ORDER — BUTORPHANOL TARTRATE 2 MG/ML IJ SOLN
1.0000 mg | Freq: Once | INTRAMUSCULAR | Status: AC
  Administered 2017-10-26: 1 mg via INTRAVENOUS

## 2017-10-26 MED ORDER — ONDANSETRON HCL 4 MG/2ML IJ SOLN: 4.0000 mg | INTRAMUSCULAR | Status: DC | PRN

## 2017-10-26 MED ORDER — FENTANYL 2.5 MCG/ML W/ROPIVACAINE 0.15% IN NS 100 ML EPIDURAL (ARMC)
EPIDURAL | Status: DC | PRN
  Administered 2017-10-26: 12 mL/h via EPIDURAL

## 2017-10-26 MED ORDER — DOCUSATE SODIUM 100 MG PO CAPS
100.0000 mg | ORAL_CAPSULE | Freq: Two times a day (BID) | ORAL | Status: DC | PRN
  Administered 2017-10-26 – 2017-10-27 (×3): 100 mg via ORAL
  Filled 2017-10-26 (×3): qty 1

## 2017-10-26 MED ORDER — LACTATED RINGERS IV SOLN: 500.0000 mL | Freq: Once | INTRAVENOUS | Status: DC

## 2017-10-26 MED ORDER — PHENYLEPHRINE 40 MCG/ML (10ML) SYRINGE FOR IV PUSH (FOR BLOOD PRESSURE SUPPORT)
80.0000 ug | PREFILLED_SYRINGE | INTRAVENOUS | Status: DC | PRN
  Filled 2017-10-26: qty 5

## 2017-10-26 MED ORDER — DIBUCAINE 1 % RE OINT: 1.0000 "application " | TOPICAL_OINTMENT | RECTAL | Status: DC | PRN

## 2017-10-26 MED ORDER — PENICILLIN G POTASSIUM 5000000 UNITS IJ SOLR
2.5000 10*6.[IU] | INTRAVENOUS | Status: DC
  Filled 2017-10-26 (×4): qty 2.5

## 2017-10-26 MED ORDER — DIPHENHYDRAMINE HCL 25 MG PO CAPS: 25.0000 mg | ORAL_CAPSULE | Freq: Four times a day (QID) | ORAL | Status: DC | PRN

## 2017-10-26 MED ORDER — LACTATED RINGERS IV SOLN
INTRAVENOUS | Status: DC
  Administered 2017-10-26: 08:00:00 via INTRAVENOUS

## 2017-10-26 MED ORDER — OXYTOCIN BOLUS FROM INFUSION
500.0000 mL | Freq: Once | INTRAVENOUS | Status: AC
  Administered 2017-10-26: 500 mL via INTRAVENOUS

## 2017-10-26 MED ORDER — FENTANYL 2.5 MCG/ML W/ROPIVACAINE 0.15% IN NS 100 ML EPIDURAL (ARMC): 12.0000 mL/h | EPIDURAL | Status: DC

## 2017-10-26 MED ORDER — OXYCODONE-ACETAMINOPHEN 5-325 MG PO TABS: 2.0000 | ORAL_TABLET | ORAL | Status: DC | PRN

## 2017-10-26 MED ORDER — PENICILLIN G POT IN DEXTROSE 60000 UNIT/ML IV SOLN
3.0000 10*6.[IU] | INTRAVENOUS | Status: DC
  Filled 2017-10-26 (×4): qty 50

## 2017-10-26 MED ORDER — OXYTOCIN 40 UNITS IN LACTATED RINGERS INFUSION - SIMPLE MED
2.5000 [IU]/h | INTRAVENOUS | Status: DC
  Filled 2017-10-26: qty 1000

## 2017-10-26 MED ORDER — ONDANSETRON HCL 4 MG PO TABS: 4.0000 mg | ORAL_TABLET | ORAL | Status: DC | PRN

## 2017-10-26 MED ORDER — BENZOCAINE-MENTHOL 20-0.5 % EX AERO: 1.0000 "application " | INHALATION_SPRAY | CUTANEOUS | Status: DC | PRN

## 2017-10-26 MED ORDER — PENICILLIN G POTASSIUM 5000000 UNITS IJ SOLR
5.0000 10*6.[IU] | Freq: Once | INTRAMUSCULAR | Status: DC
  Filled 2017-10-26: qty 5

## 2017-10-26 MED ORDER — EPHEDRINE 5 MG/ML INJ
10.0000 mg | INTRAVENOUS | Status: DC | PRN
  Filled 2017-10-26: qty 2

## 2017-10-26 MED ORDER — BUTORPHANOL TARTRATE 2 MG/ML IJ SOLN
INTRAMUSCULAR | Status: AC
  Filled 2017-10-26: qty 1

## 2017-10-26 MED ORDER — SODIUM CHLORIDE 0.9 % IV SOLN
5.0000 10*6.[IU] | INTRAVENOUS | Status: AC
  Administered 2017-10-26: 5 10*6.[IU] via INTRAVENOUS
  Filled 2017-10-26: qty 5

## 2017-10-26 MED ORDER — ACETAMINOPHEN 325 MG PO TABS
650.0000 mg | ORAL_TABLET | ORAL | Status: DC | PRN
  Administered 2017-10-26: 650 mg via ORAL
  Filled 2017-10-26: qty 2

## 2017-10-26 MED ORDER — ACETAMINOPHEN 325 MG PO TABS: 650.0000 mg | ORAL_TABLET | ORAL | Status: DC | PRN

## 2017-10-26 MED ORDER — LIDOCAINE HCL (PF) 1 % IJ SOLN
30.0000 mL | INTRAMUSCULAR | Status: DC | PRN
  Filled 2017-10-26: qty 30

## 2017-10-26 MED ORDER — SIMETHICONE 80 MG PO CHEW: 80.0000 mg | CHEWABLE_TABLET | ORAL | Status: DC | PRN

## 2017-10-26 MED ORDER — COCONUT OIL OIL: 1.0000 "application " | TOPICAL_OIL | Status: DC | PRN

## 2017-10-26 MED ORDER — LACTATED RINGERS IV SOLN
500.0000 mL | INTRAVENOUS | Status: DC | PRN
  Administered 2017-10-26: 500 mL via INTRAVENOUS

## 2017-10-26 MED ORDER — LIDOCAINE HCL (PF) 1 % IJ SOLN
INTRAMUSCULAR | Status: DC | PRN
  Administered 2017-10-26: 3 mL
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
## 2017-10-26 NOTE — Progress Notes (Signed)
   10/26/17 0930  Clinical Encounter Type  Visited With Patient (person at bedside)  Visit Type Initial (order for support for patient)  Referral From Nurse  Consult/Referral To Chaplain   Chaplain responded to order for patient support.  Chaplain checked in with patient nurse who said that patient is at 9 cm but that chaplain can check in with her.  Chaplain introduced self; patient had someone at bedside.  Patient stated that she wasn't "really religious;" chaplain replied that chaplains had more functions than offering religious support.  Chaplain asked if patient would be open to chaplain checking in with her after the birth.  Patient agreeable.

## 2017-10-26 NOTE — Lactation Note (Signed)
This note was copied from a baby's chart. Lactation Consultation Note  Patient Name: Jodi Cruz Today's Date: 10/26/2017     Maternal Data  Mom has + urine drug screen for [facility], informed of our policy to not give mom's breastmilk and pump breasts and discard pumped breastmilk until neg drug screen, she elects to formula feed for now, she breast fed her 27 yr old  Feeding Feeding Type: Bottle Fed - Formula Nipple Type: Slow - flow  LATCH Score                   Interventions    Lactation Tools Discussed/Used     Consult Status      [name] 10/26/2017, 4:47 PM

## 2017-10-26 NOTE — Anesthesia Procedure Notes (Signed)
Epidural Patient location during procedure: OB Start time: 10/26/2017 8:36 AM End time: 10/26/2017 8:40 AM  Staffing Anesthesiologist: Lenard Simmer, MD Resident/CRNA: Stormy Fabian, CRNA Performed: resident/CRNA   Preanesthetic Checklist Completed: patient identified, site marked, surgical consent, pre-op evaluation, timeout performed, IV checked, risks and benefits discussed and monitors and equipment checked  Epidural Patient position: sitting Prep: ChloraPrep Patient monitoring: heart rate, continuous pulse ox and blood pressure Approach: midline Location: L3-L4 Injection technique: LOR saline  Needle:  Needle type: Tuohy  Needle gauge: 17 G Needle length: 9 cm and 9 Needle insertion depth: 7 cm Catheter type: closed end flexible Catheter size: 19 Gauge Catheter at skin depth: 11 cm Test dose: negative and 1.5% lidocaine with Epi 1:200 K  Assessment Sensory level: T10 Events: blood not aspirated, injection not painful, no injection resistance, negative IV test and no paresthesia  Additional Notes 1 attempt Pt. Evaluated and documentation done after procedure finished. Patient identified. Risks/Benefits/Options discussed with patient including but not limited to bleeding, infection, nerve damage, paralysis, failed block, incomplete pain control, headache, blood pressure changes, nausea, vomiting, reactions to medication both or allergic, itching and postpartum back pain. Confirmed with bedside nurse the patient's most recent platelet count. Confirmed with patient that they are not currently taking any anticoagulation, have any bleeding history or any family history of bleeding disorders. Patient expressed understanding and wished to proceed. All questions were answered. Sterile technique was used throughout the entire procedure. Please see nursing notes for vital signs. Test dose was given through epidural catheter and negative prior to continuing to dose epidural or start  infusion. Warning signs of high block given to the patient including shortness of breath, tingling/numbness in hands, complete motor block, or any concerning symptoms with instructions to call for help. Patient was given instructions on fall risk and not to get out of bed. All questions and concerns addressed with instructions to call with any issues or inadequate analgesia.   Patient tolerated the insertion well without immediate complications.Reason for block:procedure for pain

## 2017-10-26 NOTE — Discharge Summary (Signed)
OB Discharge Summary     Patient Name: Jodi Cruz DOB: 02/26/1991 MRN: [id number]  Date of admission: 10/26/2017 Delivering MD: Tresea Mall, CNM  Date of Delivery: 10/26/2017  Date of discharge: 10/27/2017  Admitting diagnosis: Leaking Fluid Contractions 38 wks preg Intrauterine pregnancy: [redacted]w[redacted]d     Secondary diagnosis: None      Discharge diagnosis: Term Pregnancy Delivered                                                                                                Post partum procedures: none  Augmentation: none  Complications: supplemental O2 for neonate during transition  Hospital course:  Onset of Labor With Vaginal Delivery      27 y.o. yo [id number] at [redacted]w[redacted]d was admitted in Active Labor on 10/26/2017.  Patient had an uncomplicated labor course as follows:  Membrane Rupture Time/Date: 7:00 AM ,10/26/2017   Patient had delivery of viable female 11:17 AM, 10/26/2017 Details of delivery can be found in separate delivery note.  Patient had an uncomplicated postpartum course.   She is tolerating PO intake and her pain is well controlled with PO medications. She is ambulating and voiding without difficulty.  Patient is discharged home in stable condition on 10/27/2017.   Physical exam  Vitals:   10/26/17 1638 10/26/17 1945 10/27/17 0003 10/27/17 0846  BP: 116/75 (!) 130/92 114/75 120/87  Pulse: 92 90 91   Resp: Temp: 97.8 F (36.6 C) 98.5 F (36.9 C) 98.2 F (36.8 C) 98 F (36.7 C)  TempSrc: Oral Oral Oral   SpO2: 100% 100% 100% 99%  Weight:      Height:       General: alert, cooperative and no distress Lochia: appropriate Uterine Fundus: firm Incision: N/A DVT Evaluation: No evidence of DVT seen on physical exam.  Labs: Lab Results  Component Value Date   WBC 16.3 (H) 10/27/2017   HGB 10.5 (L) 10/27/2017   HCT 30.1 (L) 10/27/2017   MCV 87.0 10/27/2017   PLT 378 10/27/2017    Discharge instruction: per After Visit  Summary.  Medications:  Allergies as of 10/27/2017   No Known Allergies     Medication List    STOP taking these medications   ondansetron 4 MG disintegrating tablet Commonly known as:  ZOFRAN-ODT     TAKE these medications   Prenatal Vitamins 0.8 MG tablet Take 1 tablet by mouth daily.       Diet: routine diet  Activity: Advance as tolerated. Pelvic rest for 6 weeks.   Outpatient follow up: Follow-up Information    Tresea Mall, CNM. Schedule an appointment as soon as possible for a visit in 6 week(s).   Specialty:  Obstetrics Why:  postpartum follow up visit Contact information: [address] [place] [address] [phone]             Postpartum contraception: Undecided Rhogam Given postpartum: NA Rubella vaccine given postpartum: Rubella Immune Varicella vaccine given postpartum: Varicella Immune TDaP given antepartum or postpartum: to be given prior to discharge  Newborn Data: Live born female  Birth  Weight: 7 lb 2.6 oz (3250 g) APGAR: 5, 6, 9  Newborn Delivery   Birth date/time:  10/26/2017 11:17:00 Delivery type:  Vaginal, Spontaneous      Baby Feeding: Breast and formula  Disposition: home with mother  SIGNED:  Oswaldo Conroy, CNM 10/27/2017 12:25 PM
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
## 2017-10-26 NOTE — Clinical Social Work Note (Signed)
CSW attempted to see patient this afternoon however, nursing was completing admission with patient. CSW will attempt to see at a later time. York Spaniel MSW,LCSW [phone]

## 2017-10-27 LAB — CBC
HEMATOCRIT: 30.1 % — AB (ref 35.0–47.0)
HEMOGLOBIN: 10.5 g/dL — AB (ref 12.0–16.0)
MCH: 30.2 pg (ref 26.0–34.0)
MCHC: 34.8 g/dL (ref 32.0–36.0)
MCV: 87 fL (ref 80.0–100.0)
Platelets: 378 10*3/uL (ref 150–440)
RBC: 3.46 MIL/uL — ABNORMAL LOW (ref 3.80–5.20)
RDW: 13.3 % (ref 11.5–14.5)
WBC: 16.3 10*3/uL — AB (ref 3.6–11.0)

## 2017-10-27 LAB — RPR: RPR Ser Ql: NONREACTIVE

## 2017-10-27 LAB — CULTURE, BETA STREP (GROUP B ONLY): SPECIAL REQUESTS: NORMAL

## 2017-10-27 NOTE — Progress Notes (Signed)
Pt declined TDaP vaccine at this time. Education and risks were discussed with patient and family, pt stated she will discuss with provider at follow up.

## 2017-10-27 NOTE — Anesthesia Postprocedure Evaluation (Signed)
Anesthesia Post Note  Patient: Jodi Cruz  Procedure(s) Performed: AN AD HOC LABOR EPIDURAL  Patient location during evaluation: Mother Baby Anesthesia Type: Epidural Level of consciousness: awake and alert Pain management: pain level controlled Vital Signs Assessment: post-procedure vital signs reviewed and stable Respiratory status: spontaneous breathing, nonlabored ventilation and respiratory function stable Cardiovascular status: stable Postop Assessment: no headache and no backache Anesthetic complications: no     Last Vitals:  Vitals:   10/27/17 0003 10/27/17 0846  BP: 114/75 120/87  Pulse: 91   Resp: 16 18  Temp: 36.8 C 36.7 C  SpO2: 100% 99%    Last Pain:  Vitals:   10/27/17 0815  TempSrc:   PainSc: 0-No pain                 Lenard Simmer

## 2017-10-27 NOTE — Clinical Social Work Maternal (Signed)
CLINICAL SOCIAL WORK MATERNAL/CHILD NOTE  Patient Details  Name: DEAUNNA OLARTE MRN: [id number] Date of Birth: 27-Aug-1990  Date:  10/27/2017  Clinical Social Worker Initiating Note:  Santiago Bumpers, MSW, [place] Date/Time: Initiated:  10/27/17/1144     Child's Name:  Lonzo Candy   Biological Parents:  Mother, Father   Need for Interpreter:  None   Reason for Referral:  Current Substance Use/Substance Use During Pregnancy    Address:  [address] [place] [address]    Phone number:  [phone] (home)     Additional phone number: [phone]  Household Members/Support Persons (HM/SP):   Household Member/Support Person 1([employer])   HM/SP Name Relationship DOB or Age  HM/SP -1 Briyah Wheelwright Grandmother    HM/SP -2        HM/SP -3        HM/SP -4        HM/SP -5        HM/SP -6        HM/SP -7        HM/SP -8          Natural Supports (not living in the home):  Neighbors, Friends, Spouse/significant other   Professional Supports:     Employment: Animator   Type of Work: Scientist, research (medical)   Education:  [employer] school graduate   Homebound arranged:    Museum/gallery curator Resources:  [employer], Multimedia programmer   Other Resources:  Physicist, medical    Cultural/Religious Considerations Which May Impact Care:  None reported  Strengths:  Ability to meet basic needs , Compliance with medical plan , Home prepared for child , Understanding of illness   Psychotropic Medications:         Pediatrician:       Pediatrician List:   [place]      Pediatrician Fax Number:    Risk Factors/Current Problems:  Substance Use    Cognitive State:  Able to Concentrate , Alert , Linear Thinking , Poor Judgement    Mood/Affect:  Irritable    CSW Assessment: The CSW met with the client at bedside to discuss the patient's positive UDS for cannabinoids. The  CSW introduced self and role in care. The patient reported that her last use was a week ago, and she typically would use cannabis via smoking weekly. The client stated that she mainly used cannabis due to severe nausea. The CSW advised the patient of current recommendations I.e. Breastfeeding with regards to cannabis use, alcohol use, and tobacco use. The patient shared that she is ready for the child with regards to basic needs (diapers, car seat, clothing, bottles); however, the FOB is currently cleaning the home in preparation for the child to discharge as the family has cats in the home.  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 The CSW explained the [employer] [place] program and asked if the patient would like referrals for resources such as child care and assistance with WIC. The patient agreed, and the CSW sent a referral for [place] as well as Scientist, research (life sciences) for parenting education. The CSW advised the patient that the infants UDS was negative for all substances; however, the cord blood is pending.  The CSW explained mandated reporting to the patient, and she verbalized understanding. The patient has had no other DSS cases opened. CSW will continue to follow to monitor cord tissue drug screen results and respond when they are available.  CSW Plan/Description:  CSW Will Continue to Monitor Umbilical Cord Tissue Drug Screen Results and Make Report if Warranted, Other Information/Referral to [place], [place] 10/27/2017, 11:46 AM

## 2017-10-27 NOTE — Progress Notes (Signed)
Reviewed D/C instructions with pt and family. Pt verbalized understanding of teaching. Discharged to home via W/C. Pt to schedule f/u appt for 6 weeks with Dr. Bonney Aid.

## 2017-10-31 ENCOUNTER — Encounter: Payer: BLUE CROSS/BLUE SHIELD | Admitting: Obstetrics and Gynecology

## 2018-03-22 IMAGING — US US OB COMP LESS 14 WK
1 series · 14 of 28 positions shown · non-contrast
Comparison: None.

CLINICAL DATA: Abdominal pain and nausea. Current assigned
gestational age of 9 weeks 6 days by prior outside ultrasound.

EXAM:
OBSTETRIC <14 WK ULTRASOUND
TECHNIQUE: Transabdominal ultrasound was performed for evaluation of the
gestation as well as the maternal uterus and adnexal regions.

[Series 1: us ob comp less 14 wk · 0.19mm/px · 14 of 67 slices shown]
[im 3/67]
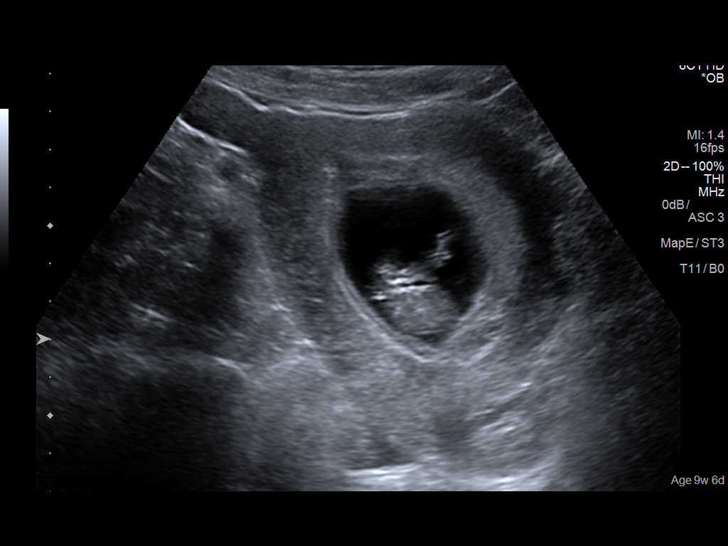
[im 8/67]
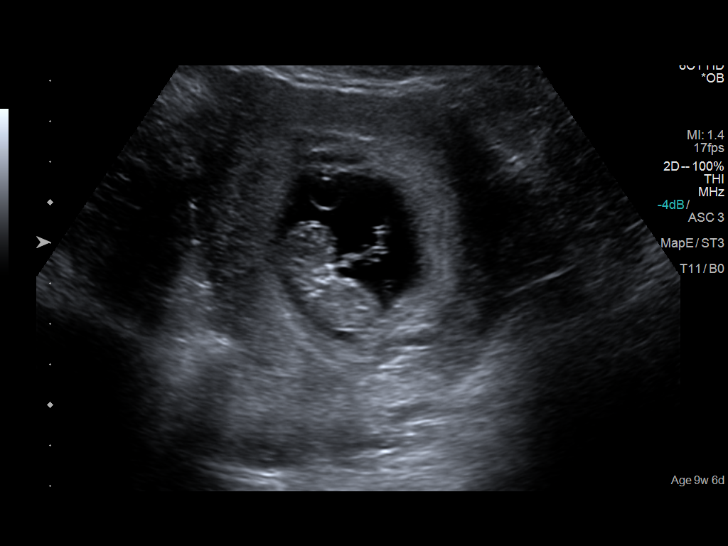
[im 13/67]
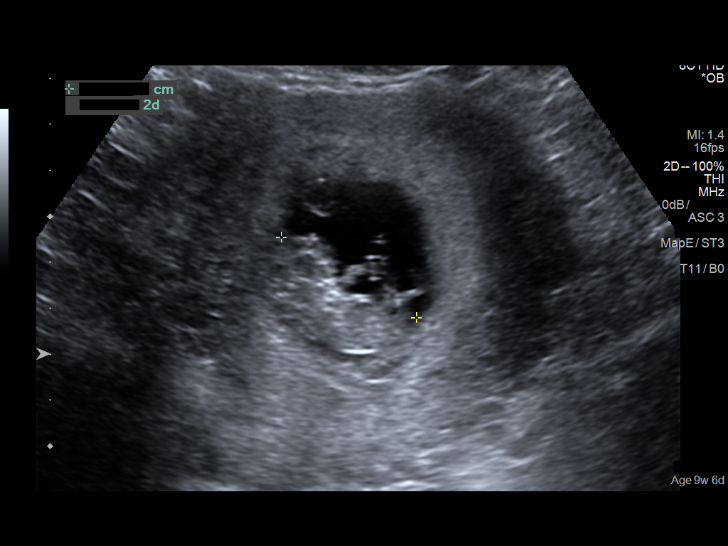
[im 18/67]
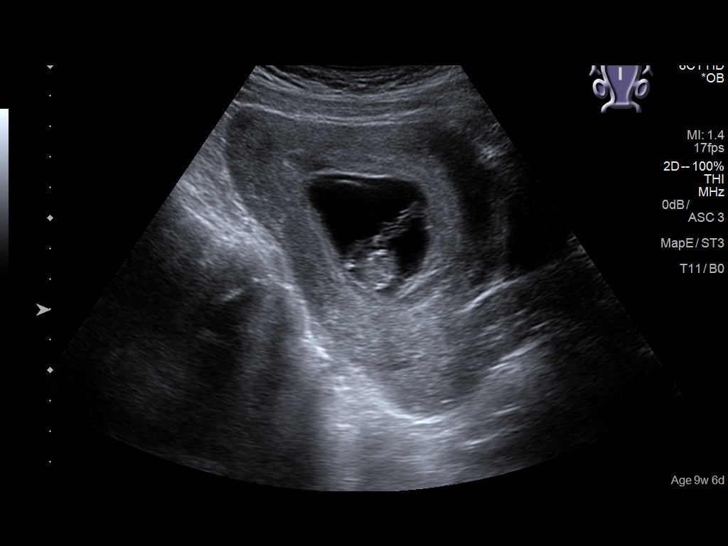
[im 23/67]
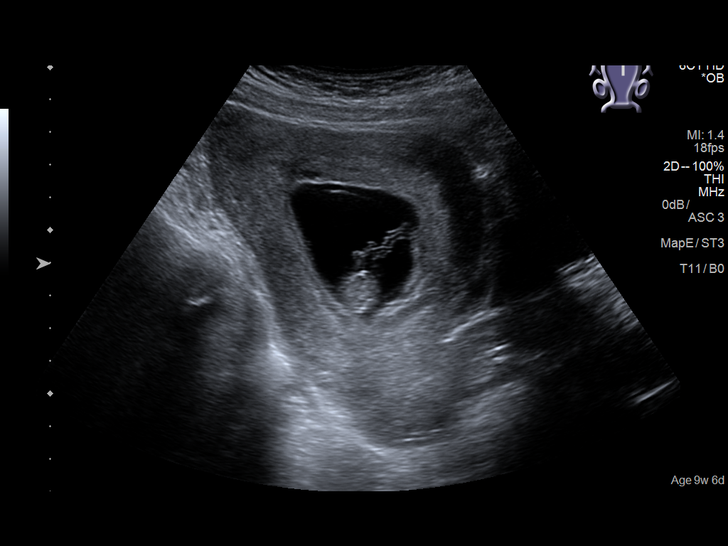
[im 27/67]
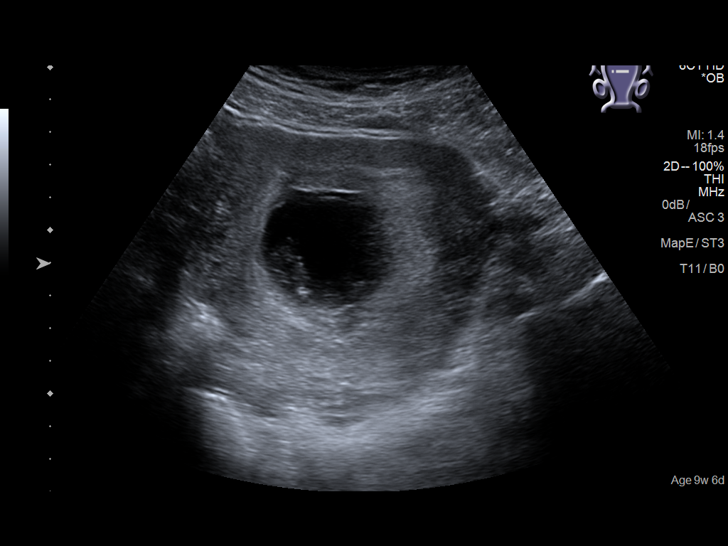
[im 32/67]
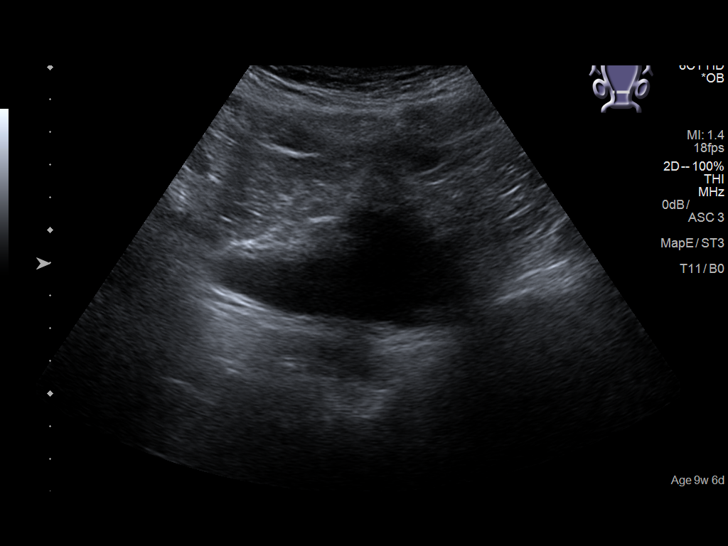
[im 37/67]
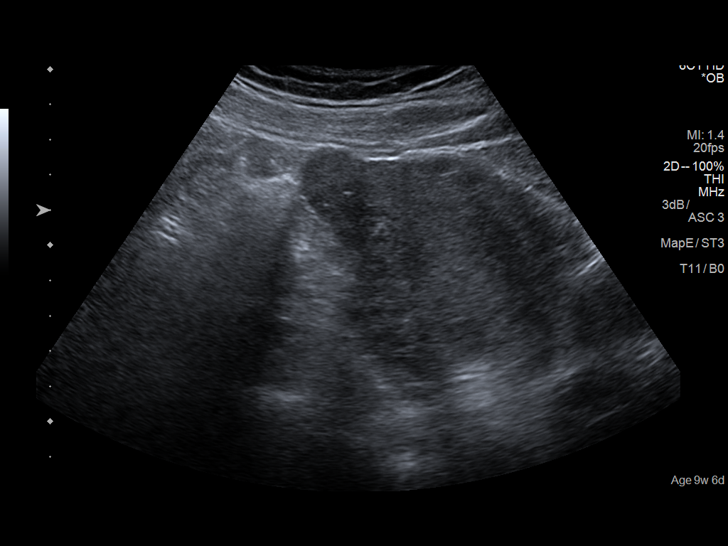
[im 42/67]
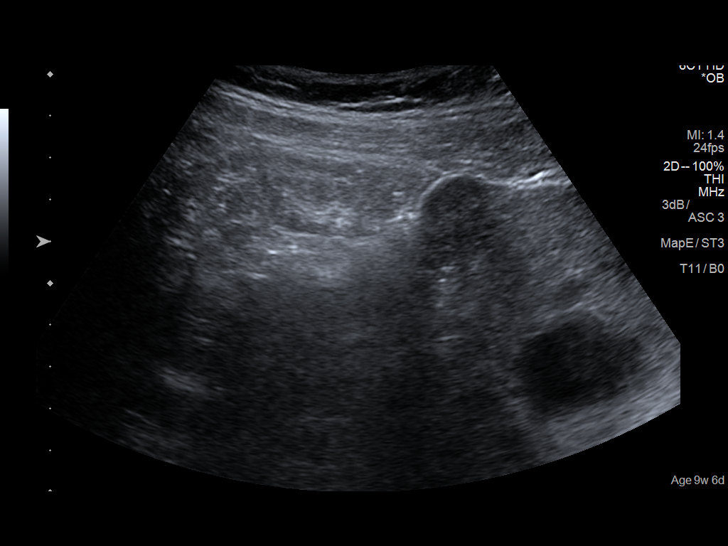
[im 47/67]
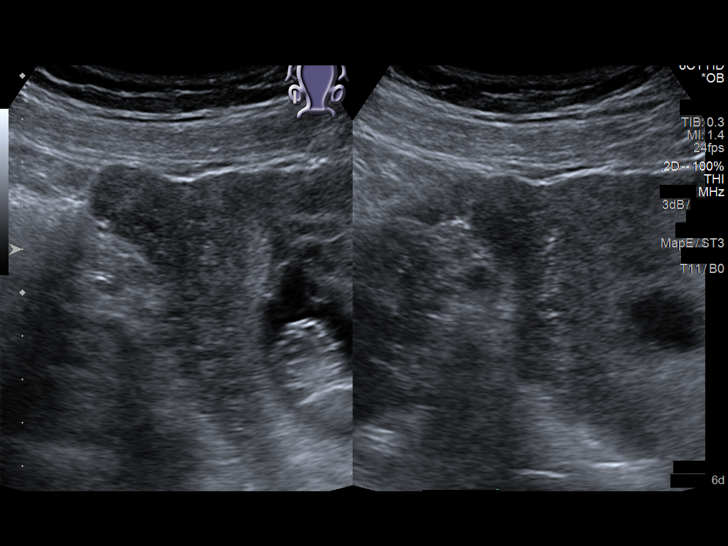
[im 52/67]
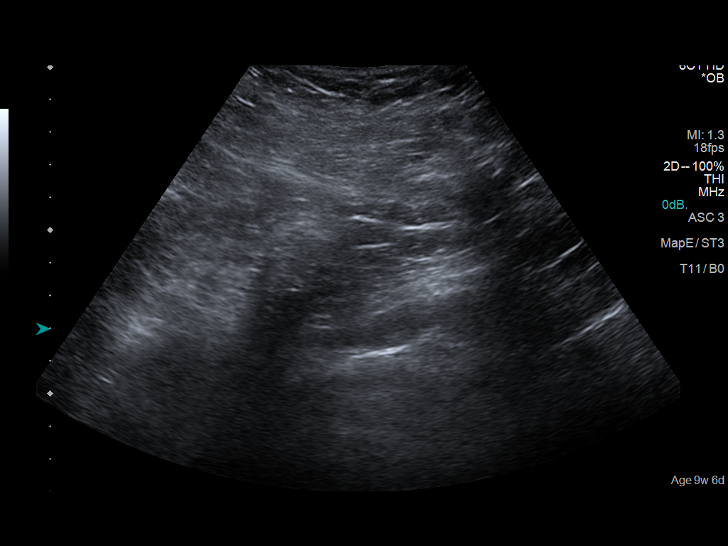
[im 57/67]
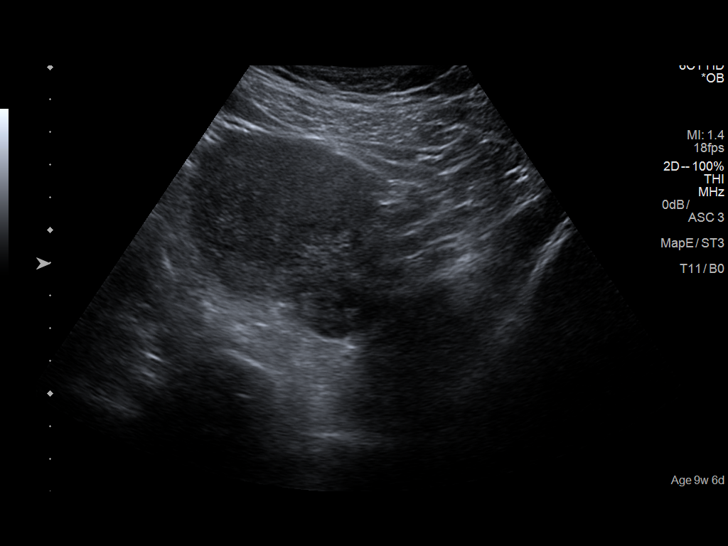
[im 62/67]
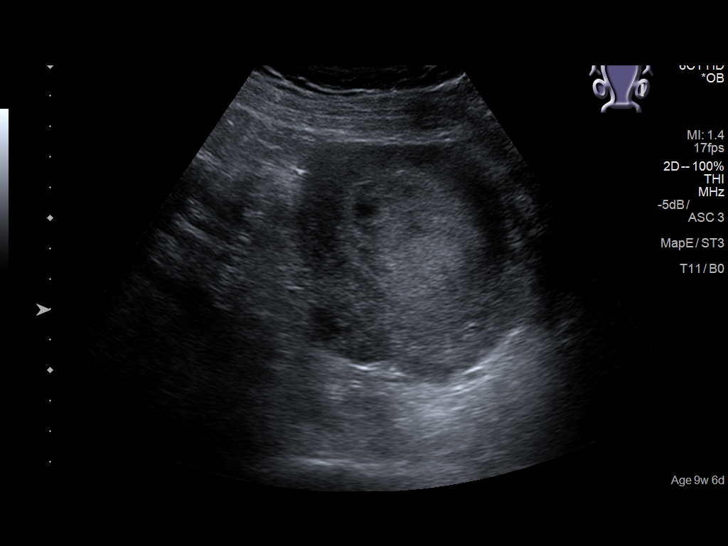
[im 67/67]
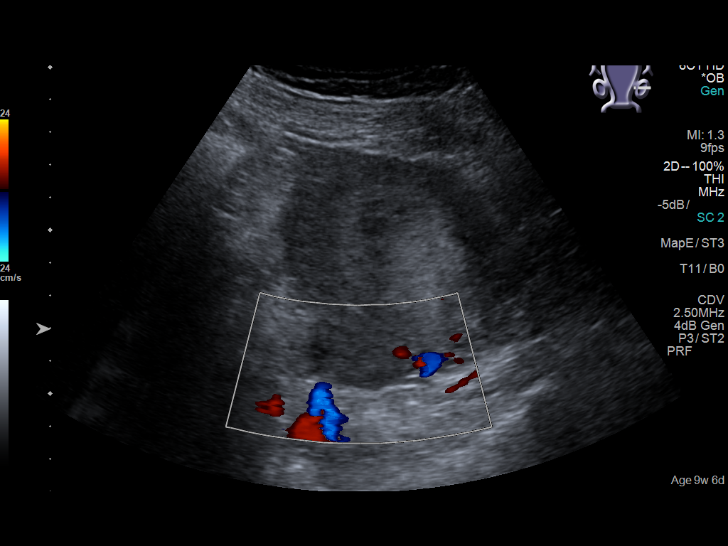

[14 of 28 positions shown; findings below may reference images not displayed]

FINDINGS: Intrauterine gestational sac: Single

Yolk sac:  Visualized.

Embryo:  Visualized.

Cardiac Activity: Visualized.

Heart Rate: 168 bpm

CRL:   34  mm   10 w 2 d                  US EDC: 11/08/2017

Subchorionic hemorrhage:  None visualized.

Maternal uterus/adnexae: Normal appearance of both ovaries. No mass
or abnormal free fluid visualized.
IMPRESSION: Single living IUP with assigned gestational age of 9 weeks 6 days by
prior outside ultrasound. Concordant size and dates.

No significant maternal uterine or adnexal abnormality identified.

## 2018-08-09 ENCOUNTER — Other Ambulatory Visit: Payer: Self-pay

## 2018-08-09 ENCOUNTER — Encounter: Payer: Self-pay | Admitting: Emergency Medicine

## 2018-08-09 ENCOUNTER — Emergency Department
Admission: EM | Admit: 2018-08-09 | Discharge: 2018-08-09 | Disposition: A | Discharge status: Home / Self Care | Payer: BLUE CROSS/BLUE SHIELD | Attending: Emergency Medicine | Admitting: Emergency Medicine

## 2018-08-09 DIAGNOSIS — F17210 Nicotine dependence, cigarettes, uncomplicated: Secondary | ICD-10-CM | POA: Insufficient documentation

## 2018-08-09 DIAGNOSIS — H66011 Acute suppurative otitis media with spontaneous rupture of ear drum, right ear: Secondary | ICD-10-CM | POA: Insufficient documentation

## 2018-08-09 DIAGNOSIS — H9201 Otalgia, right ear: Secondary | ICD-10-CM | POA: Diagnosis present

## 2018-08-09 MED ORDER — AMOXICILLIN 875 MG PO TABS: 875.0000 mg | ORAL_TABLET | Freq: Two times a day (BID) | ORAL | 0 refills | Status: AC

## 2018-08-09 MED ORDER — CIPROFLOXACIN-DEXAMETHASONE 0.3-0.1 % OT SUSP: 4.0000 [drp] | Freq: Two times a day (BID) | OTIC | 0 refills | Status: AC
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
## 2018-08-09 NOTE — ED Triage Notes (Signed)
Patient reports pain in right ear since last night. Reports clear fluid leaking this morning. Patient reports she was recently diagnosed with flu and has had a lot of congestion

## 2018-08-09 NOTE — ED Notes (Signed)
See triage note  Presents with right ear pain  States pain started last pm  Became worse this am  States pain is moving into jaw area  And she had some drainage from ear

## 2019-02-10 IMAGING — CR DG CHEST 2V
2 series · 2 of 2 positions shown · non-contrast
Comparison: October 27, 2016

CLINICAL DATA: Cough for 2 weeks

EXAM:
CHEST - 2 VIEW

[chest pa]
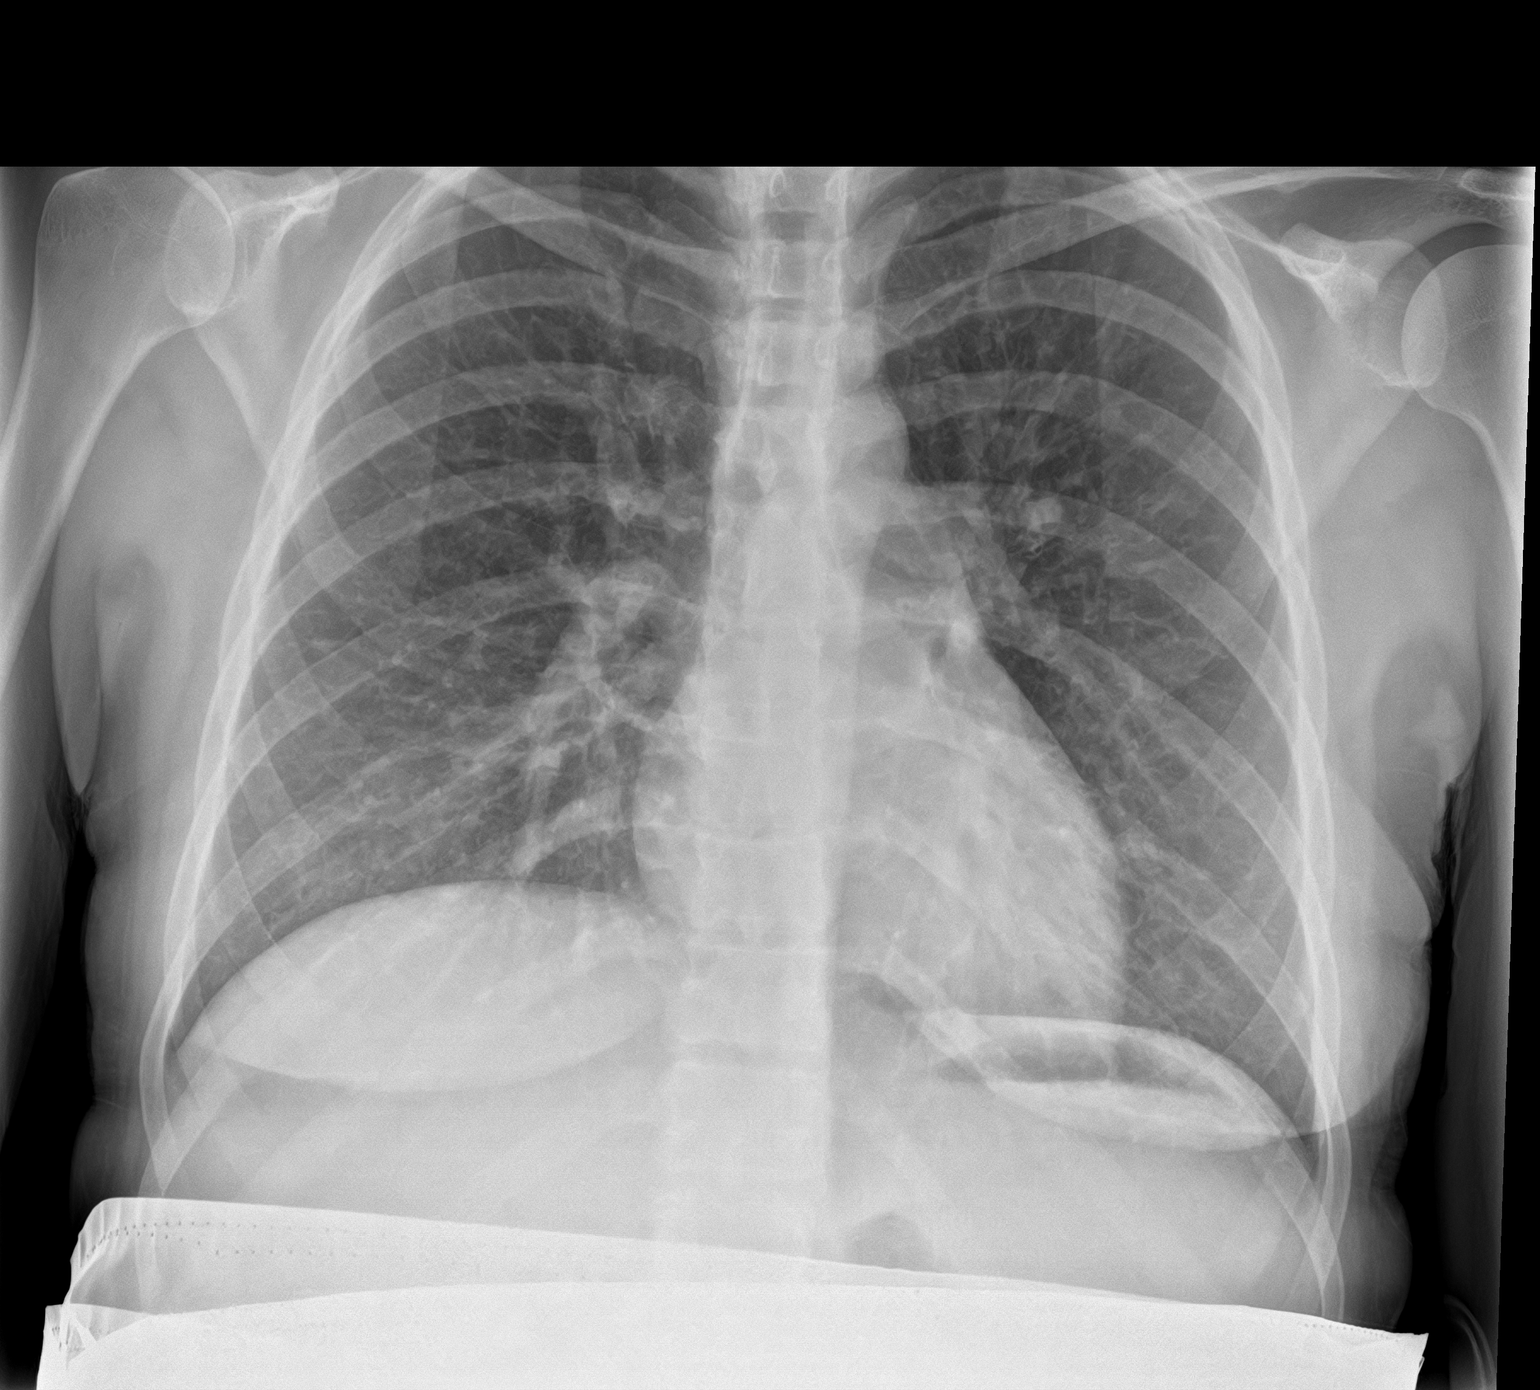

[chest lat]
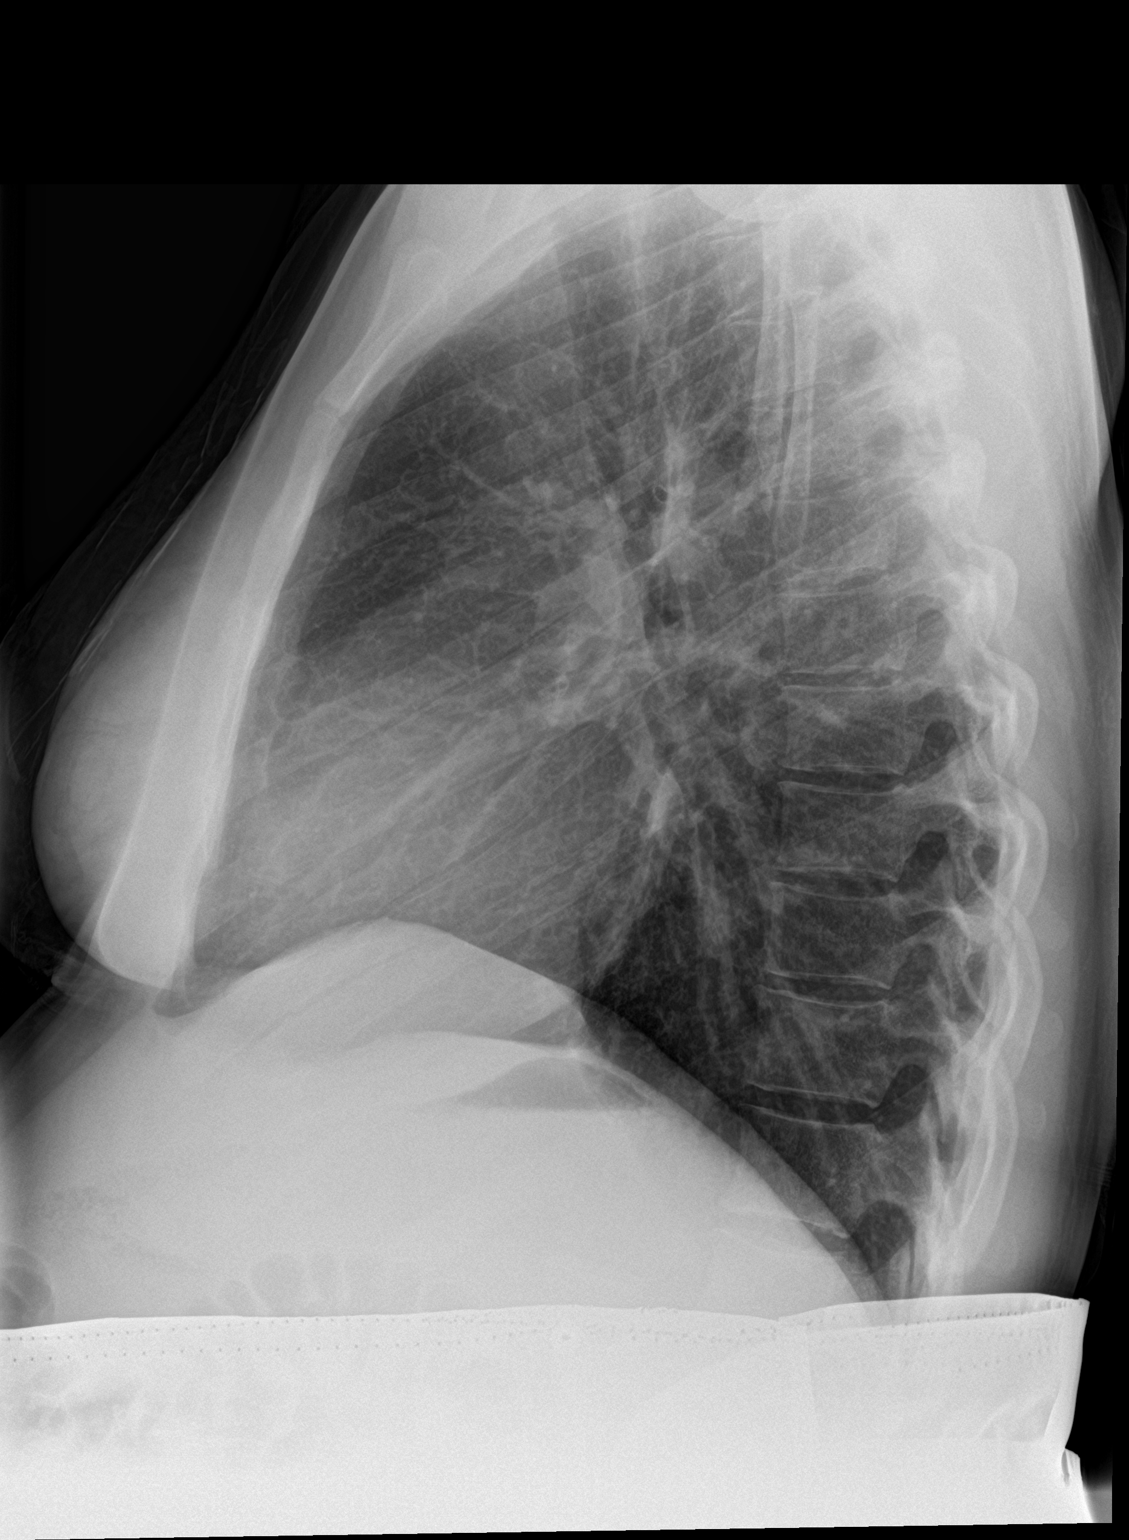

[2 of 2 positions shown; findings below may reference images not displayed]

FINDINGS: The heart size and mediastinal contours are within normal limits.
There is no focal infiltrate, pulmonary edema, or pleural effusion.
The visualized skeletal structures are unremarkable.
IMPRESSION: No active cardiopulmonary disease.

## 2019-03-26 ENCOUNTER — Encounter: Payer: BLUE CROSS/BLUE SHIELD | Admitting: Obstetrics and Gynecology

## 2019-04-17 ENCOUNTER — Encounter: Payer: Medicaid Other | Admitting: Advanced Practice Midwife

## 2019-04-21 ENCOUNTER — Emergency Department: Payer: Medicaid Other

## 2019-04-21 ENCOUNTER — Emergency Department
Admission: EM | Admit: 2019-04-21 | Discharge: 2019-04-21 | Disposition: A | Discharge status: Home / Self Care | Payer: Medicaid Other | Attending: Emergency Medicine | Admitting: Emergency Medicine

## 2019-04-21 ENCOUNTER — Other Ambulatory Visit: Payer: Self-pay

## 2019-04-21 ENCOUNTER — Encounter: Payer: Self-pay | Admitting: Emergency Medicine

## 2019-04-21 DIAGNOSIS — O209 Hemorrhage in early pregnancy, unspecified: Secondary | ICD-10-CM | POA: Diagnosis present

## 2019-04-21 DIAGNOSIS — O200 Threatened abortion: Secondary | ICD-10-CM

## 2019-04-21 DIAGNOSIS — Z3A01 Less than 8 weeks gestation of pregnancy: Secondary | ICD-10-CM | POA: Diagnosis not present

## 2019-04-21 DIAGNOSIS — F17210 Nicotine dependence, cigarettes, uncomplicated: Secondary | ICD-10-CM | POA: Insufficient documentation

## 2019-04-21 DIAGNOSIS — F1210 Cannabis abuse, uncomplicated: Secondary | ICD-10-CM | POA: Diagnosis not present

## 2019-04-21 LAB — CBC WITH DIFFERENTIAL/PLATELET
Abs Immature Granulocytes: 0.02 10*3/uL (ref 0.00–0.07)
Basophils Absolute: 0.1 10*3/uL (ref 0.0–0.1)
Basophils Relative: 1 %
Eosinophils Absolute: 0.1 10*3/uL (ref 0.0–0.5)
Eosinophils Relative: 1 %
HCT: 38.3 % (ref 36.0–46.0)
Hemoglobin: 13.2 g/dL (ref 12.0–15.0)
Immature Granulocytes: 0 %
Lymphocytes Relative: 28 %
Lymphs Abs: 2.3 10*3/uL (ref 0.7–4.0)
MCH: 31.7 pg (ref 26.0–34.0)
MCHC: 34.5 g/dL (ref 30.0–36.0)
MCV: 92.1 fL (ref 80.0–100.0)
Monocytes Absolute: 0.6 10*3/uL (ref 0.1–1.0)
Monocytes Relative: 7 %
Neutro Abs: 5.3 10*3/uL (ref 1.7–7.7)
Neutrophils Relative %: 63 %
Platelets: 361 10*3/uL (ref 150–400)
RBC: 4.16 MIL/uL (ref 3.87–5.11)
RDW: 12.5 % (ref 11.5–15.5)
WBC: 8.4 10*3/uL (ref 4.0–10.5)
nRBC: 0 % (ref 0.0–0.2)

## 2019-04-21 LAB — HCG, QUANTITATIVE, PREGNANCY: hCG, Beta Chain, Quant, S: 16988 m[IU]/mL — ABNORMAL HIGH (ref <5)

## 2019-04-21 LAB — BASIC METABOLIC PANEL
Anion gap: 8 (ref 5–15)
BUN: 9 mg/dL (ref 6–20)
CO2: 22 mmol/L (ref 22–32)
Calcium: 9 mg/dL (ref 8.9–10.3)
Chloride: 106 mmol/L (ref 98–111)
Creatinine, Ser: 0.51 mg/dL (ref 0.44–1.00)
GFR calc Af Amer: >60 mL/min (ref >60)
GFR calc non Af Amer: >60 mL/min (ref >60)
Glucose, Bld: 81 mg/dL (ref 70–99)
Potassium: 3.5 mmol/L (ref 3.5–5.1)
Sodium: 136 mmol/L (ref 135–145)

## 2019-04-21 LAB — ABO/RH: ABO/RH(D): A POS

## 2019-04-21 LAB — POCT PREGNANCY, URINE: Preg Test, Ur: POSITIVE — AB

## 2019-04-21 NOTE — ED Notes (Signed)
Patient ambulatory to lobby with steady gait and NAD noted. Verbalized understanding of discharge instructions and follow-up care.  
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
## 2019-04-21 NOTE — ED Notes (Signed)
Informed pt an order for an [place] has been entered and tech will come for her when it's her turn

## 2019-04-21 NOTE — ED Triage Notes (Signed)
Pt reports vaginal bleeding that started about 2 hours ago; says she got up to use the bathroom and noticed some blood on the toilet paper when she wiped; pt has had home positive pregnancy test-LMP 02/09/2019; pt denies pain;

## 2019-04-25 ENCOUNTER — Telehealth: Payer: Self-pay

## 2019-04-25 NOTE — Telephone Encounter (Signed)
Pt calling triage- was at ER Monday for some little bright red blood that stopped before she left hospital. Was good for days until today when she woke up from a nap. Says its brown blood. Advised normal for early pregnancy and after intercourse. She mentioned she did have intercourse. Denies any abnormal pain. Will go to ER if heavy bleeding or abnormal pain.

## 2019-04-28 ENCOUNTER — Encounter: Payer: Self-pay | Admitting: Obstetrics and Gynecology

## 2019-04-28 ENCOUNTER — Ambulatory Visit (INDEPENDENT_AMBULATORY_CARE_PROVIDER_SITE_OTHER): Payer: Medicaid Other

## 2019-04-28 ENCOUNTER — Other Ambulatory Visit: Payer: Self-pay

## 2019-04-28 ENCOUNTER — Ambulatory Visit (INDEPENDENT_AMBULATORY_CARE_PROVIDER_SITE_OTHER): Payer: Medicaid Other | Admitting: Obstetrics and Gynecology

## 2019-04-28 VITALS — BP 120/70 | HR 84 | Ht 67.0 in | Wt 176.0 lb

## 2019-04-28 DIAGNOSIS — O3680X0 Pregnancy with inconclusive fetal viability, not applicable or unspecified: Secondary | ICD-10-CM | POA: Diagnosis not present

## 2019-04-28 DIAGNOSIS — Z3A00 Weeks of gestation of pregnancy not specified: Secondary | ICD-10-CM | POA: Diagnosis not present
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
## 2019-05-02 ENCOUNTER — Other Ambulatory Visit: Payer: Self-pay

## 2019-05-02 ENCOUNTER — Telehealth: Payer: Self-pay

## 2019-05-02 ENCOUNTER — Emergency Department
Admission: EM | Admit: 2019-05-02 | Discharge: 2019-05-02 | Disposition: A | Discharge status: Home / Self Care | Payer: Medicaid Other | Attending: Emergency Medicine | Admitting: Emergency Medicine

## 2019-05-02 DIAGNOSIS — Z5321 Procedure and treatment not carried out due to patient leaving prior to being seen by health care provider: Secondary | ICD-10-CM | POA: Diagnosis not present

## 2019-05-02 DIAGNOSIS — N939 Abnormal uterine and vaginal bleeding, unspecified: Secondary | ICD-10-CM | POA: Diagnosis present

## 2019-05-02 LAB — BASIC METABOLIC PANEL
Anion gap: 10 (ref 5–15)
BUN: 9 mg/dL (ref 6–20)
CO2: 20 mmol/L — ABNORMAL LOW (ref 22–32)
Calcium: 9 mg/dL (ref 8.9–10.3)
Chloride: 106 mmol/L (ref 98–111)
Creatinine, Ser: 0.49 mg/dL (ref 0.44–1.00)
GFR calc Af Amer: >60 mL/min (ref >60)
GFR calc non Af Amer: >60 mL/min (ref >60)
Glucose, Bld: 96 mg/dL (ref 70–99)
Potassium: 3.5 mmol/L (ref 3.5–5.1)
Sodium: 136 mmol/L (ref 135–145)

## 2019-05-02 LAB — CBC WITH DIFFERENTIAL/PLATELET
Abs Immature Granulocytes: 0.02 10*3/uL (ref 0.00–0.07)
Basophils Absolute: 0.1 10*3/uL (ref 0.0–0.1)
Basophils Relative: 1 %
Eosinophils Absolute: 0 10*3/uL (ref 0.0–0.5)
Eosinophils Relative: 0 %
HCT: 38 % (ref 36.0–46.0)
Hemoglobin: 13 g/dL (ref 12.0–15.0)
Immature Granulocytes: 0 %
Lymphocytes Relative: 23 %
Lymphs Abs: 2.1 10*3/uL (ref 0.7–4.0)
MCH: 31.6 pg (ref 26.0–34.0)
MCHC: 34.2 g/dL (ref 30.0–36.0)
MCV: 92.5 fL (ref 80.0–100.0)
Monocytes Absolute: 0.5 10*3/uL (ref 0.1–1.0)
Monocytes Relative: 5 %
Neutro Abs: 6.4 10*3/uL (ref 1.7–7.7)
Neutrophils Relative %: 71 %
Platelets: 336 10*3/uL (ref 150–400)
RBC: 4.11 MIL/uL (ref 3.87–5.11)
RDW: 12.8 % (ref 11.5–15.5)
WBC: 9.1 10*3/uL (ref 4.0–10.5)
nRBC: 0 % (ref 0.0–0.2)

## 2019-05-02 LAB — SAMPLE TO BLOOD BANK

## 2019-05-02 LAB — HCG, QUANTITATIVE, PREGNANCY: hCG, Beta Chain, Quant, S: 2139 m[IU]/mL — ABNORMAL HIGH (ref <5)

## 2019-05-02 NOTE — ED Triage Notes (Signed)
Pt presents via POV c/o vaginal bleeding x2 days. Reports spotting x2 weeks ago. Reports large amount of vaginal bleeding. Reports associated SOB. Reports apx 5wks OB.

## 2019-05-02 NOTE — Telephone Encounter (Signed)
Pt calling triage saying shes pretty sure she is experiencing a miscarriage, loosing a lot of blood. Called pt back to advise to head to ER, no answer, LVMTRC.

## 2019-05-02 NOTE — ED Notes (Signed)
Pt notified myself that she was leaving.

## 2019-05-02 NOTE — Telephone Encounter (Signed)
Pt called back and will head to ER once she figures out child care for her two kids.
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
## 2019-05-08 ENCOUNTER — Ambulatory Visit: Payer: Medicaid Other

## 2019-05-08 ENCOUNTER — Ambulatory Visit: Payer: Medicaid Other | Admitting: Obstetrics & Gynecology

## 2019-05-21 ENCOUNTER — Ambulatory Visit: Payer: Medicaid Other

## 2019-05-21 ENCOUNTER — Ambulatory Visit: Payer: Medicaid Other | Admitting: Obstetrics & Gynecology

## 2020-07-02 ENCOUNTER — Telehealth: Payer: Self-pay

## 2020-07-02 NOTE — Telephone Encounter (Signed)
Patient just got her period less than a week ago, but is having some weird d/c & spotting. She tested Covid+ yesterday. Uncertain if this has any affect on what's going on.  [phone]

## 2020-07-02 NOTE — Telephone Encounter (Signed)
Spoke with patient by phone- she denies any concerns for itching/irritation/STDs. Recommend watchful waiting, OTC products as needed, or come to clinic after quarantine time for vaginitis evaluation.

## 2020-07-02 NOTE — Telephone Encounter (Signed)
Spoke w/patient. Advised stress/trauma to body (possibly related to Covid) can cause irregular/BTB. The d/c she noticed yesterday is thick yellow (not fishy) but strong odor. Denies itching.

## 2020-09-08 IMAGING — US US OB < 14 WEEKS - US OB TV
1 series · 14 of 28 positions shown · non-contrast
Comparison: None.

CLINICAL DATA: Vaginal bleeding in the first trimester

EXAM:
OBSTETRIC <14 WK US AND TRANSVAGINAL OB US
TECHNIQUE: Both transabdominal and transvaginal ultrasound examinations were
performed for complete evaluation of the gestation as well as the
maternal uterus, adnexal regions, and pelvic cul-de-sac.
Transvaginal technique was performed to assess early pregnancy.

[Series 1: us ob < 14 weeks - us ob tv · 120 acquisitions, 14 frames shown]
[im 5/120]
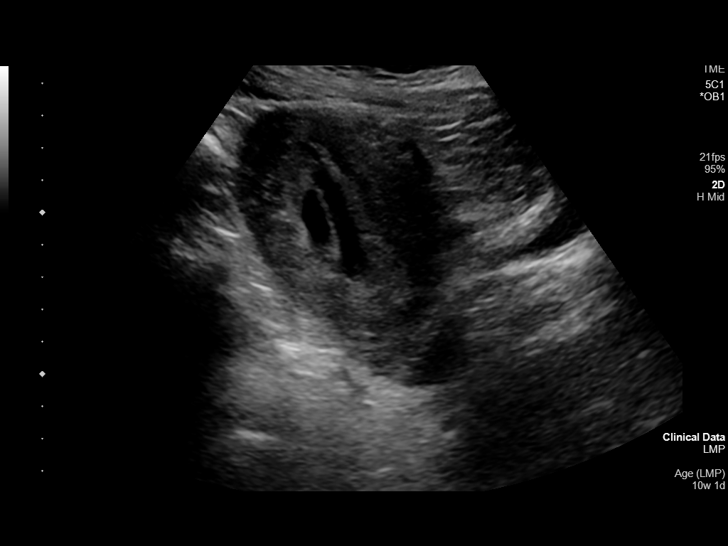
[im 14/120]
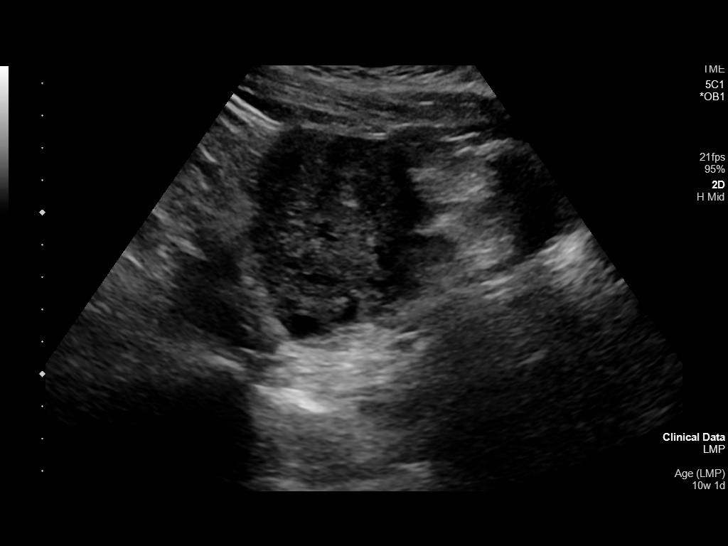
[im 23/120]
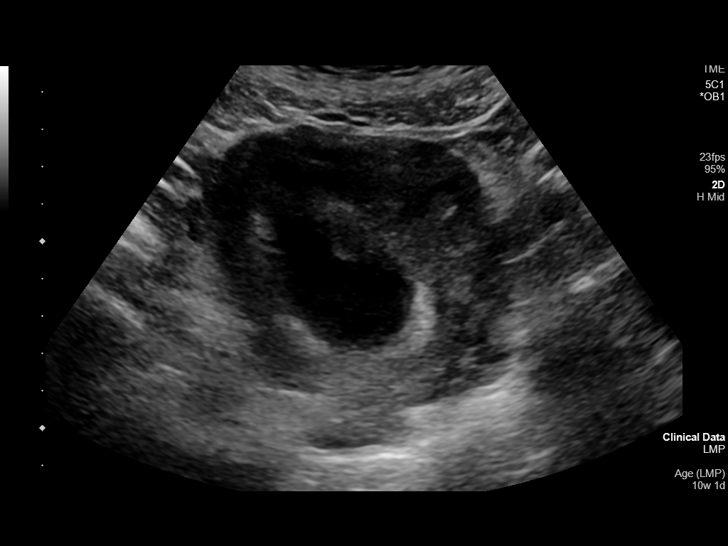
[im 31/120]
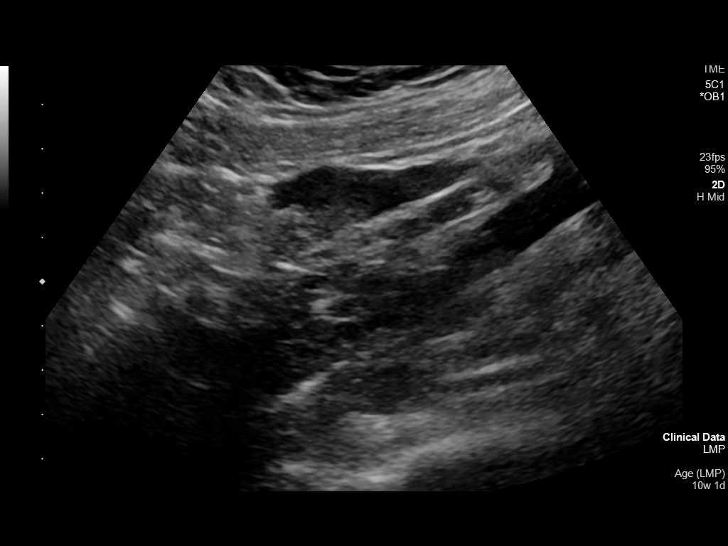
[im 40/120]
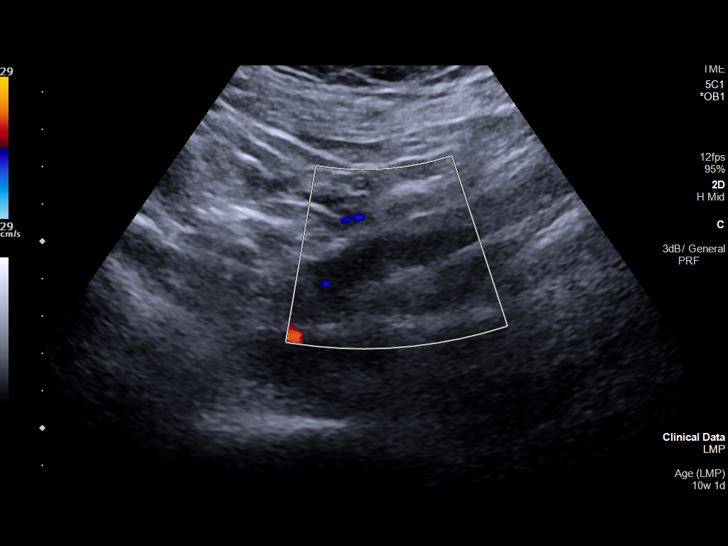
[im 49/120]
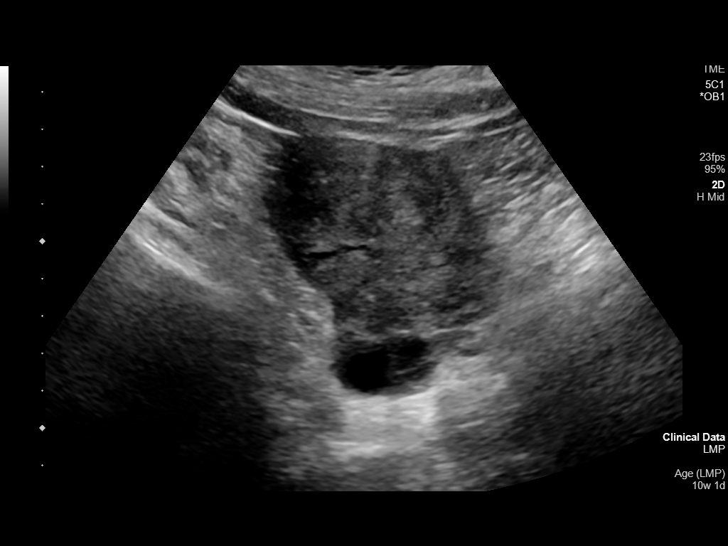
[im 58/120]
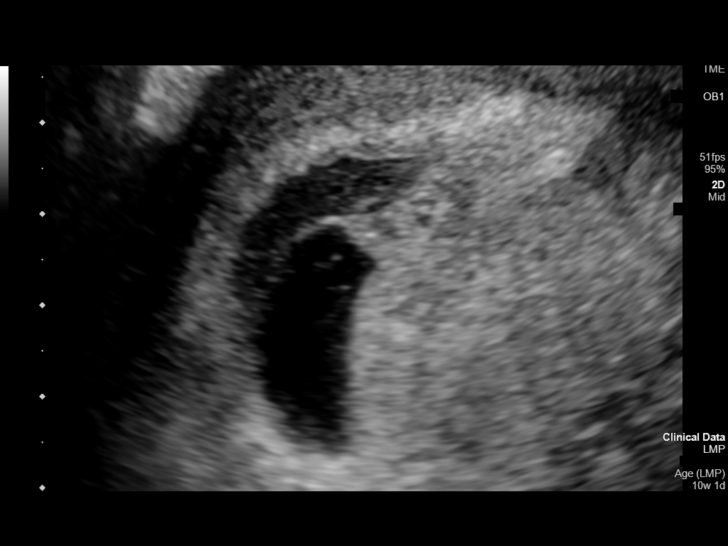
[im 67/120]
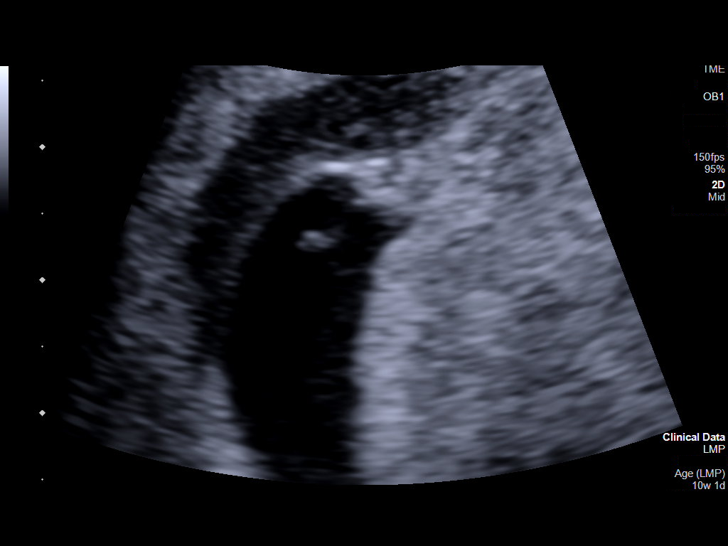
[im 75/120]
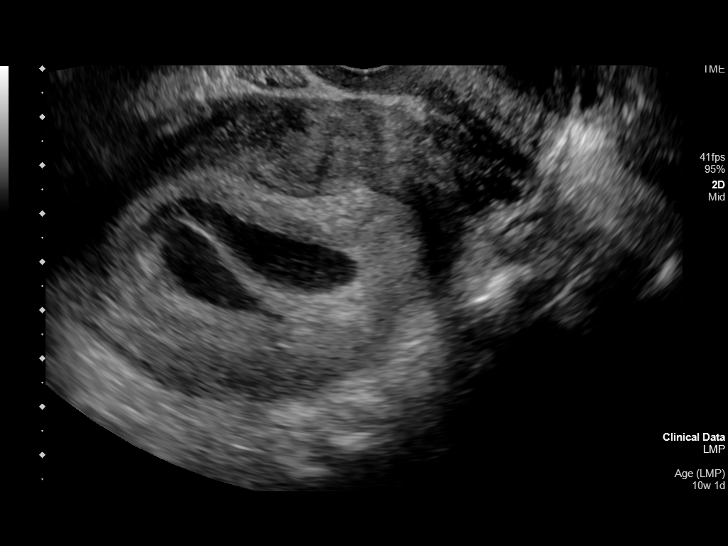
[im 84/120]
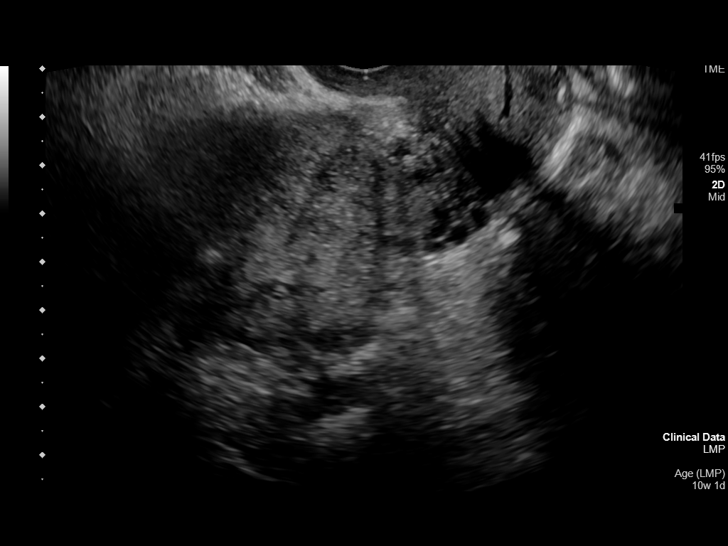
[im 93/120]
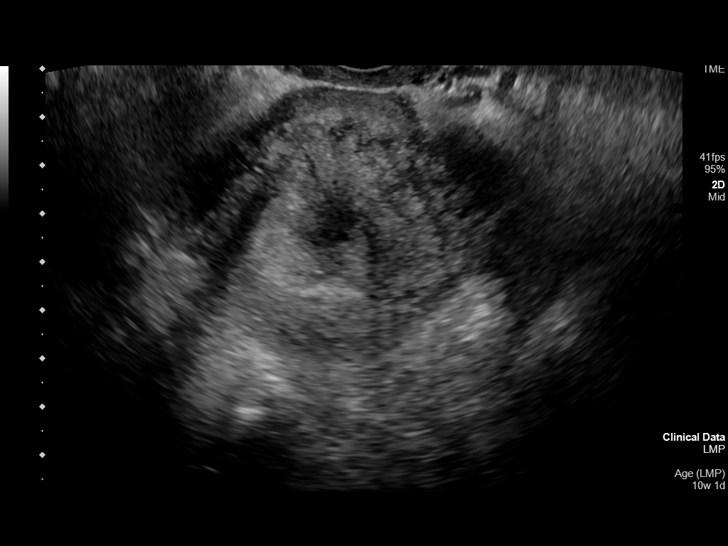
[im 102/120]
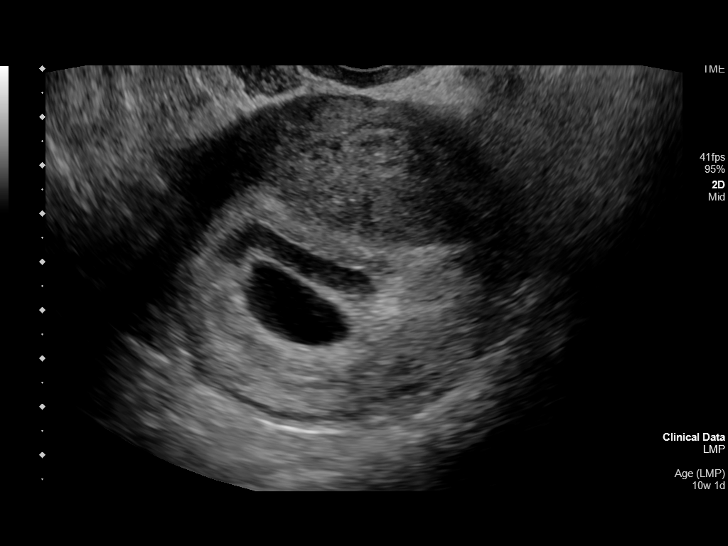
[im 111/120]
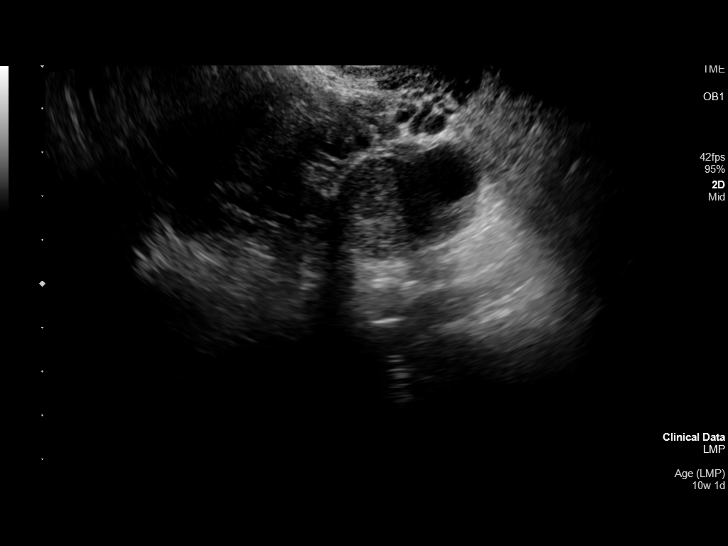
[im 120/120]
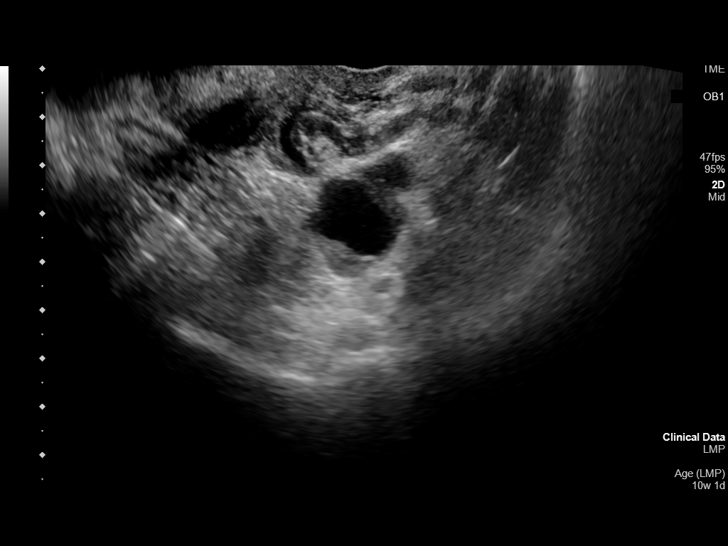

[14 of 28 positions shown; findings below may reference images not displayed]

FINDINGS: Intrauterine gestational sac: Single

Yolk sac:  Visualized.

Embryo:  Visualized.

Cardiac Activity: Not Visualized.

CRL: 2.6 mm   5 w   6 d                  US EDC: 12/16/2019

Subchorionic hemorrhage: There is a moderate to large volume of
subchorionic hemorrhage.

Maternal uterus/adnexae: There is a probable corpus luteal cyst
within the left ovary. The right ovary is unremarkable.
IMPRESSION: 1. Single IUP at 5 weeks and 6 days. No cardiac activity identified
at this time.
2. Moderate volume subchorionic hemorrhage.

## 2021-01-16 ENCOUNTER — Encounter: Payer: Self-pay | Admitting: Emergency Medicine

## 2021-01-16 ENCOUNTER — Ambulatory Visit
Admission: EM | Admit: 2021-01-16 | Discharge: 2021-01-16 | Disposition: A | Discharge status: Home / Self Care | Payer: Medicaid Other | Attending: Orthopedic Surgery | Admitting: Orthopedic Surgery

## 2021-01-16 ENCOUNTER — Other Ambulatory Visit: Payer: Self-pay

## 2021-01-16 DIAGNOSIS — H66003 Acute suppurative otitis media without spontaneous rupture of ear drum, bilateral: Secondary | ICD-10-CM | POA: Diagnosis not present

## 2021-01-16 DIAGNOSIS — J40 Bronchitis, not specified as acute or chronic: Secondary | ICD-10-CM

## 2021-01-16 MED ORDER — ALBUTEROL SULFATE HFA 108 (90 BASE) MCG/ACT IN AERS: 1.0000 | INHALATION_SPRAY | Freq: Four times a day (QID) | RESPIRATORY_TRACT | 0 refills | Status: AC | PRN

## 2021-01-16 MED ORDER — AMOXICILLIN-POT CLAVULANATE 875-125 MG PO TABS: 1.0000 | ORAL_TABLET | Freq: Two times a day (BID) | ORAL | 0 refills | Status: AC

## 2021-01-16 MED ORDER — PREDNISONE 10 MG PO TABS: 10.0000 mg | ORAL_TABLET | Freq: Every day | ORAL | 0 refills | Status: DC
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
## 2021-01-16 NOTE — ED Triage Notes (Addendum)
Patient c/o cough and chest congestion for 5 days.  Patient c/o right ear pain that started 3 days ago.  Patient states that her daughter as Croup.  Patient denies fevers.  PAtient states that she does not want a covid test at this time.

## 2021-01-16 NOTE — Discharge Instructions (Addendum)
Please take antibiotics as prescribed as well as oral steroid taper.  Use albuterol as needed.  Please continue with over-the-counter cough medication.  If any worsening cough or fevers return to the urgent care.

## 2021-02-25 ENCOUNTER — Ambulatory Visit
Admission: EM | Admit: 2021-02-25 | Discharge: 2021-02-25 | Disposition: A | Discharge status: Home / Self Care | Payer: Medicaid Other | Attending: Family Medicine | Admitting: Family Medicine

## 2021-02-25 ENCOUNTER — Other Ambulatory Visit: Payer: Self-pay

## 2021-02-25 ENCOUNTER — Encounter: Payer: Self-pay | Admitting: Emergency Medicine

## 2021-02-25 DIAGNOSIS — F17210 Nicotine dependence, cigarettes, uncomplicated: Secondary | ICD-10-CM | POA: Insufficient documentation

## 2021-02-25 DIAGNOSIS — J069 Acute upper respiratory infection, unspecified: Secondary | ICD-10-CM | POA: Diagnosis not present

## 2021-02-25 DIAGNOSIS — R0981 Nasal congestion: Secondary | ICD-10-CM | POA: Diagnosis present

## 2021-02-25 DIAGNOSIS — Z20822 Contact with and (suspected) exposure to covid-19: Secondary | ICD-10-CM | POA: Insufficient documentation

## 2021-02-25 LAB — POCT RAPID STREP A: Streptococcus, Group A Screen (Direct): NEGATIVE

## 2021-02-25 MED ORDER — CETIRIZINE-PSEUDOEPHEDRINE ER 5-120 MG PO TB12: 1.0000 | ORAL_TABLET | Freq: Two times a day (BID) | ORAL | 0 refills | Status: AC | PRN

## 2021-02-25 MED ORDER — IPRATROPIUM BROMIDE 0.06 % NA SOLN: 2.0000 | Freq: Four times a day (QID) | NASAL | 0 refills | Status: AC | PRN

## 2021-02-25 NOTE — Discharge Instructions (Signed)
Rest, fluids.  Medication as prescribed.  COVID testing will be back tomorrow.  Take care  Dr. Adriana Simas

## 2021-02-25 NOTE — ED Triage Notes (Signed)
Patient c/o runny nose, nasal congestion, and bodyaches that started 2 days ago.

## 2021-02-26 LAB — SARS CORONAVIRUS 2 (TAT 6-24 HRS): SARS Coronavirus 2: NEGATIVE
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
## 2021-02-28 ENCOUNTER — Telehealth (HOSPITAL_COMMUNITY): Payer: Self-pay | Admitting: Emergency Medicine

## 2021-02-28 LAB — CULTURE, GROUP A STREP (THRC)

## 2021-02-28 MED ORDER — PENICILLIN V POTASSIUM 500 MG PO TABS: 500.0000 mg | ORAL_TABLET | Freq: Two times a day (BID) | ORAL | 0 refills | Status: AC

## 2021-11-07 ENCOUNTER — Ambulatory Visit
Admission: EM | Admit: 2021-11-07 | Discharge: 2021-11-07 | Disposition: A | Discharge status: Home / Self Care | Payer: Medicaid Other | Attending: Internal Medicine | Admitting: Internal Medicine

## 2021-11-07 ENCOUNTER — Encounter: Payer: Self-pay | Admitting: Internal Medicine

## 2021-11-07 DIAGNOSIS — A084 Viral intestinal infection, unspecified: Secondary | ICD-10-CM | POA: Diagnosis not present

## 2021-11-07 DIAGNOSIS — N926 Irregular menstruation, unspecified: Secondary | ICD-10-CM | POA: Diagnosis not present

## 2021-11-07 LAB — POCT URINE PREGNANCY: Preg Test, Ur: NEGATIVE

## 2021-11-07 MED ORDER — ONDANSETRON 4 MG PO TBDP: 4.0000 mg | ORAL_TABLET | Freq: Three times a day (TID) | ORAL | 0 refills | Status: AC | PRN

## 2021-11-07 NOTE — Discharge Instructions (Signed)
You may take pepto as directed per the box for the diarrhea Stay on BRAT diet for 2 days, then advance slowly.
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
## 2021-11-07 NOTE — ED Triage Notes (Addendum)
Pt presents with nausea, vomiting and diarrhea x 3 days. Pt is able to drink fluids and eat. She does report her period is late and she took an at home pregnancy test and it was negative. At home Covid test yesterday was negative.

## 2021-11-17 ENCOUNTER — Ambulatory Visit: Payer: Medicaid Other | Admitting: Nurse Practitioner

## 2022-12-01 ENCOUNTER — Emergency Department
Admission: EM | Admit: 2022-12-01 | Discharge: 2022-12-01 | Disposition: A | Discharge status: Home / Self Care | Payer: Medicaid Other | Attending: Emergency Medicine | Admitting: Emergency Medicine

## 2022-12-01 ENCOUNTER — Other Ambulatory Visit: Payer: Self-pay

## 2022-12-01 ENCOUNTER — Emergency Department: Payer: Medicaid Other

## 2022-12-01 DIAGNOSIS — O26892 Other specified pregnancy related conditions, second trimester: Secondary | ICD-10-CM | POA: Diagnosis present

## 2022-12-01 DIAGNOSIS — Z3A18 18 weeks gestation of pregnancy: Secondary | ICD-10-CM | POA: Diagnosis not present

## 2022-12-01 DIAGNOSIS — O99891 Other specified diseases and conditions complicating pregnancy: Secondary | ICD-10-CM | POA: Diagnosis not present

## 2022-12-01 DIAGNOSIS — M545 Low back pain, unspecified: Secondary | ICD-10-CM | POA: Insufficient documentation

## 2022-12-01 DIAGNOSIS — R1030 Lower abdominal pain, unspecified: Secondary | ICD-10-CM | POA: Diagnosis not present

## 2022-12-01 LAB — BASIC METABOLIC PANEL
Anion gap: 9 (ref 5–15)
BUN: 9 mg/dL (ref 6–20)
CO2: 19 mmol/L — ABNORMAL LOW (ref 22–32)
Calcium: 8.3 mg/dL — ABNORMAL LOW (ref 8.9–10.3)
Chloride: 106 mmol/L (ref 98–111)
Creatinine, Ser: 0.49 mg/dL (ref 0.44–1.00)
GFR, Estimated: >60 mL/min (ref >60)
Glucose, Bld: 80 mg/dL (ref 70–99)
Potassium: 3.6 mmol/L (ref 3.5–5.1)
Sodium: 134 mmol/L — ABNORMAL LOW (ref 135–145)

## 2022-12-01 LAB — CBC
HCT: 37.1 % (ref 36.0–46.0)
Hemoglobin: 12.6 g/dL (ref 12.0–15.0)
MCH: 30.7 pg (ref 26.0–34.0)
MCHC: 34 g/dL (ref 30.0–36.0)
MCV: 90.3 fL (ref 80.0–100.0)
Platelets: 381 10*3/uL (ref 150–400)
RBC: 4.11 MIL/uL (ref 3.87–5.11)
RDW: 13.1 % (ref 11.5–15.5)
WBC: 7.5 10*3/uL (ref 4.0–10.5)
nRBC: 0 % (ref 0.0–0.2)

## 2022-12-01 LAB — URINALYSIS, ROUTINE W REFLEX MICROSCOPIC
Bilirubin Urine: NEGATIVE
Glucose, UA: NEGATIVE mg/dL
Hgb urine dipstick: NEGATIVE
Ketones, ur: NEGATIVE mg/dL
Leukocytes,Ua: NEGATIVE
Nitrite: NEGATIVE
Protein, ur: NEGATIVE mg/dL
Specific Gravity, Urine: 1.024 (ref 1.005–1.030)
pH: 6 (ref 5.0–8.0)

## 2022-12-01 LAB — PREGNANCY, URINE: Preg Test, Ur: POSITIVE — AB

## 2022-12-01 MED ORDER — ACETAMINOPHEN 500 MG PO TABS
1000.0000 mg | ORAL_TABLET | Freq: Once | ORAL | Status: AC
  Administered 2022-12-01: 1000 mg via ORAL
  Filled 2022-12-01: qty 2

## 2022-12-01 MED ORDER — SODIUM CHLORIDE 0.9 % IV BOLUS
1000.0000 mL | Freq: Once | INTRAVENOUS | Status: AC
  Administered 2022-12-01: 1000 mL via INTRAVENOUS

## 2022-12-01 MED ORDER — CYCLOBENZAPRINE HCL 5 MG PO TABS: 5.0000 mg | ORAL_TABLET | Freq: Three times a day (TID) | ORAL | 0 refills | Status: AC | PRN

## 2022-12-01 MED ORDER — CYCLOBENZAPRINE HCL 10 MG PO TABS
5.0000 mg | ORAL_TABLET | Freq: Once | ORAL | Status: AC
  Administered 2022-12-01: 5 mg via ORAL
  Filled 2022-12-01: qty 1

## 2022-12-01 MED ORDER — MORPHINE SULFATE (PF) 2 MG/ML IV SOLN
2.0000 mg | Freq: Once | INTRAVENOUS | Status: AC
  Administered 2022-12-01: 2 mg via INTRAVENOUS
  Filled 2022-12-01: qty 1

## 2022-12-01 NOTE — Discharge Instructions (Signed)
Continue taking Tylenol up to 1000 mg every 6 hours (for maximum of 4 g daily), you may take up to 3 times daily as needed for back pain or spasms.  Make sure to drink plenty of fluids.  Follow-up with your OB/GYN as scheduled.  Return to the ER for worsening, persistent severe back or abdominal pain, vaginal bleeding or discharge, urinary symptoms, fevers, flank pain, vomiting or any other new or worsening symptoms that concern you.

## 2022-12-01 NOTE — ED Provider Notes (Signed)
[facility] Provider Note    Event Date/Time   First MD Initiated Contact with Patient 12/01/22 1932     (approximate)   History   Back Pain and UTI on tx   HPI  Jodi Cruz is a 32 y.o. female [id number] at 18 weeks based on ultrasound who presents with lower back and lower abdominal pain over the last several days.  The patient states that the pain started in the middle of her lower back and somewhat more on the right side.  It then radiated around to both sides of her lower abdomen.  She now has it in the lower back and lower abdomen.  She states that she was seen at urgent care several days ago, diagnosed with UTI with a urine positive for nitrates.  She started on Keflex and has been taking it for the last several days although there has been no improvement in her pain.  It is now more prominent in the lower abdomen.  She has been taking Tylenol with no significant relief.  The patient has some crampy pain in her thighs as well.  She denies any weakness or numbness in the legs.  She has no vaginal bleeding or discharge.  I reviewed the past medical records.  The patient was seen by Dr. Feliberto Gottron from OB/GYN on 4/18 for an initial visit and again on 5/22.   Physical Exam   Triage Vital Signs: ED Triage Vitals  Enc Vitals Group     BP 12/01/22 1656 138/81     Pulse Rate 12/01/22 1656 96     Resp 12/01/22 1656 16     Temp 12/01/22 1656 98.5 F (36.9 C)     Temp Source 12/01/22 1656 Oral     SpO2 12/01/22 1656 98 %     Weight 12/01/22 1658 190 lb (86.2 kg)     Height 12/01/22 1658 5\' 8"  (1.727 m)     Head Circumference --      Peak Flow --      Pain Score 12/01/22 1657 8     Pain Loc --      Pain Edu? --      Excl. in GC? --     Most recent vital signs: Vitals:   12/01/22 2130 12/01/22 2147  BP: 113/76   Pulse: 87   Resp:    Temp:  98.1 F (36.7 C)  SpO2: 97%      General: And oriented, well-appearing, no distress.  CV:  Good  peripheral perfusion.  Resp:  Normal effort.  Abd:  Soft and nontender.  Gravid, otherwise no no distention.  Other:  5/5 motor strength and intact sensation to bilateral lower extremities.  Mild bilateral lumbar paraspinal tenderness.     ED Results / Procedures / Treatments   Labs (all labs ordered are listed, but only abnormal results are displayed) Labs Reviewed  URINALYSIS, ROUTINE W REFLEX MICROSCOPIC - Abnormal; Notable for the following components:      Result Value   Color, Urine YELLOW (*)    APPearance HAZY (*)    All other components within normal limits  BASIC METABOLIC PANEL - Abnormal; Notable for the following components:   Sodium 134 (*)    CO2 19 (*)    Calcium 8.3 (*)    All other components within normal limits  PREGNANCY, URINE - Abnormal; Notable for the following components:   Preg Test, Ur POSITIVE (*)    All other components within  normal limits  CBC     EKG     RADIOLOGY  US OB: I independently viewed and interpreted the images; there is an IUP with FHR.  Radiology report indicates the following.  IMPRESSION:  Approximately 18 week 3 day intrauterine pregnancy. Fetal heart rate  152 beats per minute. No acute maternal findings.    This exam is performed on an emergent basis and does not  comprehensively evaluate fetal size, dating, or anatomy; follow-up  complete OB US should be considered if further fetal assessment is  warranted.     PROCEDURES:  Critical Care performed: No  Procedures   MEDICATIONS ORDERED IN ED: Medications  sodium chloride 0.9 % bolus 1,000 mL (0 mLs Intravenous Stopped 12/01/22 2124)  acetaminophen (TYLENOL) tablet 1,000 mg (1,000 mg Oral Given 12/01/22 2023)  morphine (PF) 2 MG/ML injection 2 mg (2 mg Intravenous Given 12/01/22 2023)  cyclobenzaprine (FLEXERIL) tablet 5 mg (5 mg Oral Given 12/01/22 2152)     IMPRESSION / MDM / ASSESSMENT AND PLAN / ED COURSE  I reviewed the triage vital signs and the  nursing notes.  32 year old female with PMH as noted above presents with lower back and lower abdominal pain over the last several days which has persisted despite treatment for UTI.  On exam vital signs are normal and the patient is overall well-appearing.  The abdomen is soft and nontender and there is no weakness or numbness to lower extremities.  Differential diagnosis includes, but is not limited to, UTI, musculoskeletal pain, muscle cramps, dehydration, round ligament pain.  Urinalysis shows no acute findings.  CBC shows no leukocytosis.  BMP shows normal electrolytes.  There is no evidence for active UTI or pyonephritis.  We will give a fluid bolus, analgesia, and obtain an ultrasound.  The patient has been taking Tylenol with minimal relief so asked if she could get any additional medication.  I explained risks and benefits of treatment with opiates; I think it is reasonable to give her a very low dose of morphine in the ED for temporary relief in addition to Tylenol.  Patient's presentation is most consistent with acute complicated illness / injury requiring diagnostic workup.  The patient is on the cardiac monitor to evaluate for evidence of arrhythmia and/or significant heart rate changes.  ----------------------------------------- 10:11 PM on 12/01/2022 -----------------------------------------  Ultrasound shows no acute abnormalities.  The patient is feeling somewhat better after analgesia and fluids.  At this time, she is stable for discharge home.  The pain is consistent with muscle strain/spasm in the lower back.  I have prescribed a low-dose of Flexeril after discussion with the patient about risks and benefits.  I instructed patient to follow-up with her ob/gyn practice. I gave her strict return precautions and she expressed understanding.   FINAL CLINICAL IMPRESSION(S) / ED DIAGNOSES   Final diagnoses:  Acute bilateral low back pain without sciatica     Rx / DC Orders    ED Discharge Orders          Ordered    cyclobenzaprine (FLEXERIL) 5 MG tablet  3 times daily PRN        12/01/22 2210             Note:  This document was prepared using Dragon voice recognition software and may include unintentional dictation errors.    Dionne Bucy, MD 12/01/22 [phone]

## 2022-12-01 NOTE — ED Triage Notes (Signed)
Pt to ED at [redacted] weeks pregnant for lower back pain since Wednesday. Pt went to UC, told has UTI, has been taking abx and symptoms are getting worse. Pain is now wrapping around front on both sides of belly with some cramping to abdominal area. No vaginal bleeding. Is constipated, LBM yesterday.  Pt is G3P2, has not felt FM at all yet. Pt goes to [facility]. Pt takes Lexapro since about 1 month ago.

## 2022-12-19 ENCOUNTER — Emergency Department
Admission: EM | Admit: 2022-12-19 | Discharge: 2022-12-20 | Disposition: A | Discharge status: Home / Self Care | Payer: Medicaid Other | Attending: Emergency Medicine | Admitting: Emergency Medicine

## 2022-12-19 ENCOUNTER — Other Ambulatory Visit: Payer: Self-pay

## 2022-12-19 ENCOUNTER — Encounter: Payer: Self-pay | Admitting: Emergency Medicine

## 2022-12-19 DIAGNOSIS — O99612 Diseases of the digestive system complicating pregnancy, second trimester: Secondary | ICD-10-CM | POA: Diagnosis present

## 2022-12-19 DIAGNOSIS — Z3A21 21 weeks gestation of pregnancy: Secondary | ICD-10-CM | POA: Diagnosis not present

## 2022-12-19 DIAGNOSIS — O26892 Other specified pregnancy related conditions, second trimester: Secondary | ICD-10-CM | POA: Diagnosis not present

## 2022-12-19 DIAGNOSIS — K2900 Acute gastritis without bleeding: Secondary | ICD-10-CM | POA: Diagnosis not present

## 2022-12-19 DIAGNOSIS — R110 Nausea: Secondary | ICD-10-CM

## 2022-12-19 LAB — COMPREHENSIVE METABOLIC PANEL
ALT: 15 U/L (ref 0–44)
AST: 15 U/L (ref 15–41)
Albumin: 3.6 g/dL (ref 3.5–5.0)
Alkaline Phosphatase: 55 U/L (ref 38–126)
Anion gap: 9 (ref 5–15)
BUN: 8 mg/dL (ref 6–20)
CO2: 21 mmol/L — ABNORMAL LOW (ref 22–32)
Calcium: 8.8 mg/dL — ABNORMAL LOW (ref 8.9–10.3)
Chloride: 104 mmol/L (ref 98–111)
Creatinine, Ser: 0.45 mg/dL (ref 0.44–1.00)
GFR, Estimated: >60 mL/min (ref >60)
Glucose, Bld: 83 mg/dL (ref 70–99)
Potassium: 3.5 mmol/L (ref 3.5–5.1)
Sodium: 134 mmol/L — ABNORMAL LOW (ref 135–145)
Total Bilirubin: 0.6 mg/dL (ref 0.3–1.2)
Total Protein: 6.7 g/dL (ref 6.5–8.1)

## 2022-12-19 LAB — CBC
HCT: 35.1 % — ABNORMAL LOW (ref 36.0–46.0)
Hemoglobin: 12.4 g/dL (ref 12.0–15.0)
MCH: 31.7 pg (ref 26.0–34.0)
MCHC: 35.3 g/dL (ref 30.0–36.0)
MCV: 89.8 fL (ref 80.0–100.0)
Platelets: 325 10*3/uL (ref 150–400)
RBC: 3.91 MIL/uL (ref 3.87–5.11)
RDW: 13.2 % (ref 11.5–15.5)
WBC: 9.2 10*3/uL (ref 4.0–10.5)
nRBC: 0 % (ref 0.0–0.2)

## 2022-12-19 LAB — LIPASE, BLOOD: Lipase: 26 U/L (ref 11–51)

## 2022-12-19 MED ORDER — SODIUM CHLORIDE 0.9 % IV BOLUS
1000.0000 mL | Freq: Once | INTRAVENOUS | Status: AC
  Administered 2022-12-20: 1000 mL via INTRAVENOUS

## 2022-12-19 MED ORDER — PANTOPRAZOLE SODIUM 40 MG IV SOLR
40.0000 mg | Freq: Once | INTRAVENOUS | Status: AC
  Administered 2022-12-20: 40 mg via INTRAVENOUS
  Filled 2022-12-19: qty 10

## 2022-12-19 MED ORDER — ONDANSETRON HCL 4 MG/2ML IJ SOLN
4.0000 mg | Freq: Once | INTRAMUSCULAR | Status: AC
  Administered 2022-12-19: 4 mg via INTRAVENOUS
  Filled 2022-12-19: qty 2

## 2022-12-19 MED ORDER — METOCLOPRAMIDE HCL 5 MG/ML IJ SOLN
10.0000 mg | Freq: Once | INTRAMUSCULAR | Status: AC
  Administered 2022-12-20: 10 mg via INTRAVENOUS
  Filled 2022-12-19: qty 2

## 2022-12-19 MED ORDER — SODIUM CHLORIDE 0.9 % IV BOLUS
500.0000 mL | Freq: Once | INTRAVENOUS | Status: AC
  Administered 2022-12-19: 500 mL via INTRAVENOUS

## 2022-12-19 NOTE — ED Notes (Signed)
EDP Dr Arnoldo Morale informed of pts presentation. Unable to get FHT in triage d/t pt vomitting x2.

## 2022-12-19 NOTE — ED Notes (Signed)
PT brought to ed rm 9 at this time, this RN now assuming care.  

## 2022-12-19 NOTE — ED Triage Notes (Signed)
Pt currently [redacted] weeks pregnant. OBGYN with [facility] d/t complications r/t fetal ascites. C/o constant upper abdominal pain that started last night associated with NVD. Denies vag bleeding. Denies ETOH.

## 2022-12-20 MED ORDER — PANTOPRAZOLE SODIUM 40 MG PO TBEC: 40.0000 mg | DELAYED_RELEASE_TABLET | Freq: Every day | ORAL | 1 refills | Status: AC

## 2022-12-20 MED ORDER — METOCLOPRAMIDE HCL 10 MG PO TABS: 10.0000 mg | ORAL_TABLET | Freq: Three times a day (TID) | ORAL | 0 refills | Status: AC | PRN

## 2022-12-20 NOTE — Discharge Instructions (Signed)
Please take medications as prescribed.  Return to the emergency department for any worsening upper abdominal pain worsening nausea unable to keep down fluids or any other symptom concerning to yourself.  Otherwise please follow-up with your OB/GYN.

## 2022-12-20 NOTE — ED Provider Notes (Signed)
[facility] Provider Note    Event Date/Time   First MD Initiated Contact with Patient 12/19/22 2302     (approximate)  History   Chief Complaint: Abdominal Pain  HPI  Jodi Cruz is a 32 y.o. female with a past medical history of gastritis, approximately [redacted] weeks pregnant who presents to the emergency department for nausea vomiting upper abdominal discomfort.  Patient scribes her upper abdominal discomfort as a burning discomfort.  Patient does state a history of gastritis and reflux.  Patient follows up with [facility] and [facility] for high risk pregnancy.  Denies any chest pain or shortness of breath.  No lower abdominal pain vaginal bleeding or fluid leakage.  Physical Exam   Triage Vital Signs: ED Triage Vitals  Enc Vitals Group     BP 12/19/22 2214 114/82     Pulse Rate 12/19/22 2214 (!) 113     Resp 12/19/22 2214 19     Temp 12/19/22 2214 98.3 F (36.8 C)     Temp src --      SpO2 12/19/22 2214 99 %     Weight 12/19/22 2219 180 lb (81.6 kg)     Height 12/19/22 2219 5\' 8"  (1.727 m)     Head Circumference --      Peak Flow --      Pain Score 12/19/22 2219 8     Pain Loc --      Pain Edu? --      Excl. in GC? --     Most recent vital signs: Vitals:   12/19/22 2300 12/20/22 0000  BP: 104/60 (!) 106/56  Pulse: 90 88  Resp: 18 18  Temp:    SpO2: 99% 96%    General: Awake, no distress.  CV:  Good peripheral perfusion.  Regular rate and rhythm  Resp:  Normal effort.  Equal breath sounds bilaterally.  Abd:  No distention.  Soft, slight epigastric tenderness.  No rebound or guarding.  ED Results / Procedures / Treatments    MEDICATIONS ORDERED IN ED: Medications  sodium chloride 0.9 % bolus 500 mL (500 mLs Intravenous New Bag/Given 12/19/22 2249)  ondansetron (ZOFRAN) injection 4 mg (4 mg Intravenous Given 12/19/22 2249)  pantoprazole (PROTONIX) injection 40 mg (40 mg Intravenous Given 12/20/22 0009)  sodium chloride 0.9 % bolus  1,000 mL (1,000 mLs Intravenous New Bag/Given 12/20/22 0012)  metoCLOPramide (REGLAN) injection 10 mg (10 mg Intravenous Given 12/20/22 0012)     IMPRESSION / MDM / ASSESSMENT AND PLAN / ED COURSE  I reviewed the triage vital signs and the nursing notes.  Patient's presentation is most consistent with acute presentation with potential threat to life or bodily function.  Patient presents to the emergency department approximate [redacted] weeks pregnant with nausea vomiting with upper abdominal discomfort.  Patient does state occasional diarrhea as well.  Patient states a history of reflux as well as gastritis in the past.  Reassuringly patient's lab work shows a normal CBC with normal white blood cell count, normal lipase, normal chemistry including LFTs.  Patient is feeling better after fluids and Zofran.  States mild nausea given Reglan and states the nausea is much better.  Patient now resting comfortably.  Patient given Protonix in the emergency department as well.  Symptoms are very suggestive of gastritis.  I discussed with the patient a trial of Protonix as well as Reglan to be used if needed for nausea, increasing her oral intake of fluids and following  up with her OB as soon as possible.  Patient agreeable to plan and is feeling much better.  States she is ready go home.  FINAL CLINICAL IMPRESSION(S) / ED DIAGNOSES   Nausea vomiting of pregnancy Gastritis    Note:  This document was prepared using Dragon voice recognition software and may include unintentional dictation errors.   Minna Antis, MD 12/20/22 [phone]

## 2022-12-20 NOTE — ED Notes (Signed)
ED Provider at bedside.
